# Patient Record
Sex: Female | Born: 2000
Health system: Southern US, Community
[De-identification: ages and names within clinical notes are randomized; demographics above are authoritative.]

## PROBLEM LIST (undated history)

## (undated) DIAGNOSIS — D571 Sickle-cell disease without crisis: Secondary | ICD-10-CM

---
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
## 2019-03-27 ENCOUNTER — Ambulatory Visit (INDEPENDENT_AMBULATORY_CARE_PROVIDER_SITE_OTHER): Payer: 59 | Admitting: *Deleted

## 2019-03-27 ENCOUNTER — Other Ambulatory Visit: Payer: Self-pay

## 2019-03-27 DIAGNOSIS — Z3042 Encounter for surveillance of injectable contraceptive: Secondary | ICD-10-CM

## 2019-03-27 NOTE — Progress Notes (Signed)
Agree with A & P. 

## 2019-03-27 NOTE — Progress Notes (Signed)
Pt is in office for Depo injection. Injection given, pt tolerated well.  Pt advised to RTO 1/21-06/26/2019 for next injection.   Pt states she may be interested in [place].  Pt made aware she is due for a routine visit and may discuss and possible have placed at that visit.   Pt has no other concerns today.   Administrations This Visit    medroxyPROGESTERone (DEPO-PROVERA) injection 150 mg    Admin Date 03/27/2019 Action Given Dose 150 mg Route Intramuscular Administered By Valene Bors, CMA

## 2019-03-31 ENCOUNTER — Encounter (HOSPITAL_COMMUNITY): Payer: Self-pay | Admitting: Emergency Medicine

## 2019-03-31 ENCOUNTER — Emergency Department (HOSPITAL_COMMUNITY): Payer: 59

## 2019-03-31 ENCOUNTER — Emergency Department (HOSPITAL_COMMUNITY)
Admission: EM | Admit: 2019-03-31 | Discharge: 2019-03-31 | Disposition: A | Discharge status: Home / Self Care | Payer: 59 | Attending: Emergency Medicine | Admitting: Emergency Medicine

## 2019-03-31 DIAGNOSIS — R1032 Left lower quadrant pain: Secondary | ICD-10-CM

## 2019-03-31 DIAGNOSIS — R161 Splenomegaly, not elsewhere classified: Secondary | ICD-10-CM

## 2019-03-31 DIAGNOSIS — Z7722 Contact with and (suspected) exposure to environmental tobacco smoke (acute) (chronic): Secondary | ICD-10-CM | POA: Insufficient documentation

## 2019-03-31 DIAGNOSIS — R109 Unspecified abdominal pain: Secondary | ICD-10-CM | POA: Diagnosis present

## 2019-03-31 LAB — BASIC METABOLIC PANEL
Anion gap: 10 (ref 5–15)
BUN: 7 mg/dL (ref 6–20)
CO2: 21 mmol/L — ABNORMAL LOW (ref 22–32)
Calcium: 9.3 mg/dL (ref 8.9–10.3)
Chloride: 106 mmol/L (ref 98–111)
Creatinine, Ser: 0.69 mg/dL (ref 0.44–1.00)
GFR calc Af Amer: >60 mL/min (ref >60)
GFR calc non Af Amer: >60 mL/min (ref >60)
Glucose, Bld: 88 mg/dL (ref 70–99)
Potassium: 3.8 mmol/L (ref 3.5–5.1)
Sodium: 137 mmol/L (ref 135–145)

## 2019-03-31 LAB — CBC
HCT: 34.9 % — ABNORMAL LOW (ref 36.0–46.0)
Hemoglobin: 12.3 g/dL (ref 12.0–15.0)
MCH: 27.1 pg (ref 26.0–34.0)
MCHC: 35.2 g/dL (ref 30.0–36.0)
MCV: 76.9 fL — ABNORMAL LOW (ref 80.0–100.0)
Platelets: 142 10*3/uL — ABNORMAL LOW (ref 150–400)
RBC: 4.54 MIL/uL (ref 3.87–5.11)
RDW: 15.4 % (ref 11.5–15.5)
WBC: 11.3 10*3/uL — ABNORMAL HIGH (ref 4.0–10.5)
nRBC: 0.4 % — ABNORMAL HIGH (ref 0.0–0.2)

## 2019-03-31 LAB — URINALYSIS, ROUTINE W REFLEX MICROSCOPIC
Bilirubin Urine: NEGATIVE
Glucose, UA: NEGATIVE mg/dL
Hgb urine dipstick: NEGATIVE
Ketones, ur: NEGATIVE mg/dL
Leukocytes,Ua: NEGATIVE
Nitrite: NEGATIVE
Protein, ur: NEGATIVE mg/dL
Specific Gravity, Urine: 1.016 (ref 1.005–1.030)
pH: 6 (ref 5.0–8.0)

## 2019-03-31 LAB — I-STAT BETA HCG BLOOD, ED (MC, WL, AP ONLY): I-stat hCG, quantitative: <5 m[IU]/mL (ref <5)

## 2019-03-31 MED ORDER — SODIUM CHLORIDE 0.9 % IV BOLUS
1000.0000 mL | Freq: Once | INTRAVENOUS | Status: AC
  Administered 2019-03-31: 1000 mL via INTRAVENOUS

## 2019-03-31 MED ORDER — KETOROLAC TROMETHAMINE 30 MG/ML IJ SOLN
30.0000 mg | Freq: Once | INTRAMUSCULAR | Status: AC
  Administered 2019-03-31: 30 mg via INTRAVENOUS
  Filled 2019-03-31: qty 1

## 2019-03-31 MED ORDER — ONDANSETRON HCL 4 MG/2ML IJ SOLN
4.0000 mg | Freq: Once | INTRAMUSCULAR | Status: AC
  Administered 2019-03-31: 4 mg via INTRAVENOUS
  Filled 2019-03-31: qty 2

## 2019-03-31 NOTE — ED Triage Notes (Signed)
Pt woke up this morning with a sudden pain in left groin radiating into left flank.

## 2019-03-31 NOTE — Discharge Instructions (Signed)
As we discussed, your CT scan did show mildly enlarged spleen.  You should not engage in any contact sports for 2 to 4 weeks.  Additionally, you need to follow-up with your heme-onc doctor.  You can take Tylenol or Ibuprofen as directed for pain. You can alternate Tylenol and Ibuprofen every 4 hours. If you take Tylenol at 1pm, then you can take Ibuprofen at 5pm. Then you can take Tylenol again at 9pm.   Return the emergency department for any worsening pain, vomiting, fevers or any other worsening or concerning symptoms.

## 2019-03-31 NOTE — ED Notes (Signed)
Patient transported to CT 

## 2019-03-31 NOTE — ED Provider Notes (Signed)
[facility] Provider Note   CSN: [id number] Arrival date & time: 03/31/19  1041     History   Chief Complaint Chief Complaint  Patient presents with   Flank Pain    HPI Ashley Hawkins is a 18 y.o. female who presents for evaluation of left lower quadrant pain that began this morning.  She states the pain is been constant but has varied in intensity.  She describes it as a "stabbing but dull type pain" and states that it is worse with changing positions.  She has not had any dysuria, urinary frequency, hematuria.  Last bowel movement was earlier last night and was normal.  No constipation.  Her LMP was in August and she gets a Depo shot, patient currently describes her pain as 4/10.  She took ibuprofen prior to ED arrival with no improvement in symptoms.  She states she does not smoke and denies any alcohol use.  She does report one episode of marijuana use about 3 weeks ago.  No other drugs.  She is not currently sexually active and denies any vaginal bleeding, vaginal discharge.      The history is provided by the patient.    Past Medical History:  Diagnosis Date   Sickle cell anemia (HCC)     There are no active problems to display for this patient.   No past surgical history on file.   OB History    Gravida  0   Para  0   Term  0   Preterm  0   AB  0   Living  0     SAB  0   TAB  0   Ectopic  0   Multiple  0   Live Births               Home Medications    Prior to Admission medications   Medication Sig Start Date End Date Taking? Authorizing Provider  ibuprofen (ADVIL) 200 MG tablet Take 200 mg by mouth every 6 (six) hours as needed for moderate pain.   Yes [provider]  medroxyPROGESTERone (DEPO-PROVERA) 150 MG/ML injection INJECT 1 ML INTO THE MUSCLE EVERY 3 MONTHS. Patient taking differently: Inject 150 mg into the muscle every 3 (three) months.  08/28/18   Brock BadHarper, Charles A, MD     Family History Family History  Problem Relation Age of Onset   Hypertension Maternal Uncle    Hypertension Maternal Grandmother     Social History Social History   Tobacco Use   Smoking status: Passive Smoke Exposure - Never Smoker   Smokeless tobacco: Never Used  Substance Use Topics   Alcohol use: No    Alcohol/week: 0.0 standard drinks   Drug use: No     Allergies   Codeine   Review of Systems Review of Systems  Constitutional: Negative for fever.  Respiratory: Negative for cough and shortness of breath.   Cardiovascular: Negative for chest pain.  Gastrointestinal: Positive for abdominal pain. Negative for nausea and vomiting.  Genitourinary: Negative for dysuria and hematuria.  Neurological: Negative for headaches.  All other systems reviewed and are negative.    Physical Exam Updated Vital Signs BP 119/69    Pulse 71    Temp 98.7 F (37.1 C) (Oral)    Resp 14    SpO2 100%   Physical Exam Vitals signs and nursing note reviewed.  Constitutional:      Appearance: Normal appearance. She is well-developed.  Comments: Sitting comfortably on examination table  HENT:     Head: Normocephalic and atraumatic.  Eyes:     General: Lids are normal. No scleral icterus.       Right eye: No discharge.        Left eye: No discharge.     Conjunctiva/sclera: Conjunctivae normal.     Pupils: Pupils are equal, round, and reactive to light.  Neck:     Musculoskeletal: Full passive range of motion without pain.  Cardiovascular:     Rate and Rhythm: Normal rate and regular rhythm.     Pulses: Normal pulses.     Heart sounds: Normal heart sounds. No murmur. No friction rub. No gallop.   Pulmonary:     Effort: Pulmonary effort is normal.     Breath sounds: Normal breath sounds.     Comments: Lungs clear to auscultation bilaterally.  Symmetric chest rise.  No wheezing, rales, rhonchi. Abdominal:     Palpations: Abdomen is soft. Abdomen is not rigid.      Tenderness: There is abdominal tenderness in the left lower quadrant. There is left CVA tenderness. There is no guarding.     Comments: Abdomen soft, nondistended.  Tenderness palpation the left lower quadrant.  Left-sided CVA tenderness noted.  Musculoskeletal: Normal range of motion.  Skin:    General: Skin is warm and dry.     Capillary Refill: Capillary refill takes less than 2 seconds.  Neurological:     Mental Status: She is alert and oriented to person, place, and time.  Psychiatric:        Speech: Speech normal.        Behavior: Behavior normal.      ED Treatments / Results  Labs (all labs ordered are listed, but only abnormal results are displayed) Labs Reviewed  BASIC METABOLIC PANEL - Abnormal; Notable for the following components:      Result Value   CO2 21 (*)    All other components within normal limits  CBC - Abnormal; Notable for the following components:   WBC 11.3 (*)    HCT 34.9 (*)    MCV 76.9 (*)    Platelets 142 (*)    nRBC 0.4 (*)    All other components within normal limits  URINALYSIS, ROUTINE W REFLEX MICROSCOPIC  I-STAT BETA HCG BLOOD, ED ([facility], [facility], [facility] ONLY)  GC/CHLAMYDIA PROBE AMP ([place]) NOT AT [facility]    EKG None  Radiology Ct Renal Stone Study  Result Date: 03/31/2019 CLINICAL DATA:  Left flank pain. EXAM: CT ABDOMEN AND PELVIS WITHOUT CONTRAST TECHNIQUE: Multidetector CT imaging of the abdomen and pelvis was performed following the standard protocol without IV contrast. COMPARISON:  None. FINDINGS: Lower chest: The visualized lung bases are clear. Hepatobiliary: Punctate calcification along the inferior right hepatic margin. No gallstones, gallbladder wall thickening, or biliary dilatation. Pancreas: Unremarkable. Spleen: Splenomegaly with a craniocaudal splenic length of 16 cm and an oblique AP diameter of nearly 13 cm. Adrenals/Urinary Tract: Unremarkable adrenal glands. Mild mass effect on the left kidney by the enlarged spleen. No  urolithiasis or hydroureteronephrosis. Unremarkable bladder. Stomach/Bowel: The stomach is unremarkable. There is no evidence of bowel obstruction or inflammation. Status post appendectomy. Vascular/Lymphatic: Normal caliber of the abdominal aorta. No enlarged lymph nodes. Reproductive: The uterus and both ovaries are visualized. No pelvic mass. Other: No intraperitoneal free fluid. Musculoskeletal: No acute osseous abnormality or suspicious osseous lesion. 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 IMPRESSION: 1. Splenomegaly. 2. No urinary  tract calculi or obstruction. Electronically Signed   By: Logan Bores M.D.   On: 03/31/2019 13:57    Procedures Procedures (including critical care time)  Medications Ordered in ED Medications  ketorolac (TORADOL) 30 MG/ML injection 30 mg (30 mg Intravenous Given 03/31/19 1348)  ondansetron (ZOFRAN) injection 4 mg (4 mg Intravenous Given 03/31/19 1348)  sodium chloride 0.9 % bolus 1,000 mL (0 mLs Intravenous Stopped 03/31/19 1555)     Initial Impression / Assessment and Plan / ED Course  I have reviewed the triage vital signs and the nursing notes.  Pertinent labs & imaging results that were available during my care of the patient were reviewed by me and considered in my medical decision making (see chart for details).        18 year old female who presents for evaluation of left lower quadrant abdominal pain that began this morning.  No associated nausea/vomiting, urinary complaints.  No fevers. Patient is afebrile, non-toxic appearing, sitting comfortably on examination table. Vital signs reviewed and stable.  On exam, she has tenderness palpation the left lower quadrant and left-sided CVA tenderness.  Concern for kidney stone versus GU etiology.  Also consider ectopic pregnancy versus ovarian etiology.  We will plan to check labs, urine.  I-STAT beta negative.  UA negative for any hemoglobin or infectious etiology.  BMP is unremarkable.  CBC shows  leukocytosis of 11.3.  Hemoglobin stable at 12.3.  CT renal study shows splenomegaly. No evidence of kidney stone.  No evidence of pelvic masses, uterus and ovaries are normal.  Reevaluation.  Patient is resting comfortably in bed with no signs of acute distress.  She is hemodynamically stable.  Patient reports the pain has resolved.  Repeat abdominal exam is improved with no tenderness, rigidity, guarding.  No peritoneal signs.  She has no tenderness of the left upper or left mid abdomen.  I discussed work-up with patient.  I discussed with her regarding further treatment in ED, including pelvic exam for evaluation of her left lower quadrant abdominal pain.  Patient states her pain is completely resolved and she feels comfortable.  I discussed all treatment options with her and engaged in shared decision making.  After extensive conversation with both patient and aunt, patient wishes declined pelvic exam at this time and would like to be discharged home to monitor symptoms.  At this time, I feel this is reasonable.  I did instruct patient that she needs to follow-up with heme-onc regarding the splenomegaly and that she should not engage in any contact sports. At this time, patient exhibits no emergent life-threatening condition that require further evaluation in ED or admission. Patient had ample opportunity for questions and discussion. All patient's questions were answered with full understanding. Strict return precautions discussed. Patient expresses understanding and agreement to plan.   Portions of this note were generated with [employer]. Dictation errors may occur despite best attempts at proofreading.  Final Clinical Impressions(s) / ED Diagnoses   Final diagnoses:  Left lower quadrant abdominal pain  Splenomegaly    ED Discharge Orders    None       Volanda Napoleon, PA-C 03/31/19 [place], DO 04/01/19 1537

## 2019-04-22 ENCOUNTER — Ambulatory Visit (INDEPENDENT_AMBULATORY_CARE_PROVIDER_SITE_OTHER): Payer: 59 | Admitting: Obstetrics and Gynecology

## 2019-04-22 ENCOUNTER — Other Ambulatory Visit: Payer: Self-pay

## 2019-04-22 ENCOUNTER — Encounter: Payer: Self-pay | Admitting: Obstetrics and Gynecology

## 2019-04-22 VITALS — BP 120/80 | HR 92 | Ht 64.0 in | Wt 145.4 lb

## 2019-04-22 DIAGNOSIS — Z3202 Encounter for pregnancy test, result negative: Secondary | ICD-10-CM | POA: Diagnosis not present

## 2019-04-22 DIAGNOSIS — Z113 Encounter for screening for infections with a predominantly sexual mode of transmission: Secondary | ICD-10-CM | POA: Diagnosis not present

## 2019-04-22 DIAGNOSIS — Z202 Contact with and (suspected) exposure to infections with a predominantly sexual mode of transmission: Secondary | ICD-10-CM

## 2019-04-22 DIAGNOSIS — Z30017 Encounter for initial prescription of implantable subdermal contraceptive: Secondary | ICD-10-CM

## 2019-04-22 DIAGNOSIS — Z01419 Encounter for gynecological examination (general) (routine) without abnormal findings: Secondary | ICD-10-CM

## 2019-04-22 LAB — POCT URINE PREGNANCY: Preg Test, Ur: NEGATIVE

## 2019-04-22 MED ORDER — ETONOGESTREL 68 MG ~~LOC~~ IMPL
68.0000 mg | DRUG_IMPLANT | Freq: Once | SUBCUTANEOUS | Status: AC
  Administered 2019-04-22: 68 mg via SUBCUTANEOUS

## 2019-04-22 NOTE — Progress Notes (Signed)
Pt is here for annual gyn exam. Pt is currently using depo for contraception and she is up to date on those injections. Pt desires STD testing today, swab only

## 2019-04-22 NOTE — Progress Notes (Signed)
WELL-WOMAN PHYSICAL Patient name: NAIRI OSWALD MRN [id number]  Date of birth: [birth year] Chief Complaint:   Gynecologic Exam  History of Present Illness:   Ashley Hawkins is a 18 y.o. G0P0000 African American female being seen today for a routine well-woman exam.  Current complaints: desires STI testing and Nexplanon insertion  PCP: none      does desire labs vaginal swabs only -- declines blood testing Patient's last menstrual period was 04/15/2019 (exact date). The current method of family planning is Depo-Provera injections.  Last pap n/a. Results were: n/a  Review of Systems:   Pertinent items are noted in HPI Denies any headaches, blurred vision, fatigue, shortness of breath, chest pain, abdominal pain, abnormal vaginal discharge/itching/odor/irritation, problems with periods, bowel movements, urination, or intercourse unless otherwise stated above. Pertinent History Reviewed:  Reviewed past medical,surgical, social and family history.  Reviewed problem list, medications and allergies. Physical Assessment:   Vitals:   04/22/19 0942  BP: 120/80  Pulse: 92  Weight: 145 lb 6.4 oz (66 kg)  Height: 5\' 4"  (1.626 m)  Body mass index is 24.96 kg/m.        Physical Examination:   General appearance - well appearing, and in no distress  Mental status - alert, oriented to person, place, and time  Psych:  She has a normal mood and affect  Skin - warm and dry, normal color, no suspicious lesions noted  Chest - effort normal, all lung fields clear to auscultation bilaterally  Heart - normal rate and regular rhythm  Neck:  midline trachea, no thyromegaly or nodules  Breasts - breasts appear normal, no suspicious masses, no skin or nipple changes or  axillary nodes  Abdomen - soft, nontender, nondistended, no masses or organomegaly  Pelvic - VULVA: normal appearing vulva with no masses, tenderness or lesions  VAGINA: normal appearing vagina with normal color and discharge, no  lesions   Blind swab performed for STI testing      Nexplanon Insertion Procedure Patient identified, informed consent performed, consent signed.   Patient does understand that irregular bleeding is a very common side effect of this medication. She was advised to have backup contraception for one week after placement. Pregnancy test in clinic today was negative.  Appropriate time out taken.  Patient's left arm was prepped and draped in the usual sterile fashion. The ruler used to measure and mark insertion area.  Patient was prepped with alcohol swab and then injected with 1.5 ml of 1% lidocaine.  She was prepped with betadine, Nexplanon removed from packaging,  Device confirmed in needle, then inserted full length of needle and withdrawn per handbook instructions. Nexplanon was able to palpated in the patient's arm; patient palpated the insert herself. There was minimal blood loss.  Patient insertion site covered with guaze and a pressure bandage to reduce any bruising.  The patient tolerated the procedure well and was given post procedure instructions. Follow-up via My Chart video visit , unless having problems and need to be seen in the office.  Nexplanon Lot#: [id number] / Expiration Date: 04/18/2021    Results for orders placed or performed in visit on 04/22/19 (from the past 24 hour(s))  POCT urine pregnancy   Collection Time: 04/22/19 10:13 AM  Result Value Ref Range   Preg Test, Ur Negative Negative    Assessment & Plan:  1) Well-Woman Exam   2) Insertion of Nexplanon  - POCT urine pregnancy  3) Possible exposure to STD  - Cervicovaginal  ancillary only( [place]) - will treat according to results   Labs/procedures today: STI (vaginal swab only)   Orders Placed This Encounter  Procedures  . POCT urine pregnancy    Meds:  Meds ordered this encounter  Medications  . etonogestrel (NEXPLANON) implant 68 mg    Follow-up: Return in about 4 weeks (around 05/20/2019) for My  Chart follow-up to Nexplanon.  Raelyn Mora MSN, CNM 04/22/2019 10:14 AM

## 2019-04-22 NOTE — Patient Instructions (Signed)

## 2019-04-23 LAB — CERVICOVAGINAL ANCILLARY ONLY
Chlamydia: NEGATIVE
Comment: NEGATIVE
Comment: NEGATIVE
Comment: NORMAL
Neisseria Gonorrhea: NEGATIVE
Trichomonas: NEGATIVE

## 2019-04-24 NOTE — Progress Notes (Signed)
Subjective: Ashley Hawkins is a G0P0000 who presents to the [facility] today for birth control consult.  She does not have a history of any mental health concerns. She is currently sexually active. She is currently using depo for birth control. Patient reports safe sex practices with condom use however wants a maintenance free option.   BP 120/80   Pulse 92   Ht 5\' 4"  (1.626 m)   Wt 145 lb 6.4 oz (66 kg)   LMP 04/15/2019 (Exact Date)   Breastfeeding No   BMI 24.96 kg/m   Birth Control History:  depo  MDM Patient counseled on all options for birth control today including LARC. Patient desires nexplanon initiated for birth control  Assessment:  18 y.o. female desires nexplanon for birth control  Plan: No further plan   Lynnea Ferrier, Marlinda Mike 04/24/2019 10:30 AM

## 2019-06-23 ENCOUNTER — Ambulatory Visit: Payer: 59 | Attending: Internal Medicine

## 2019-06-23 DIAGNOSIS — Z20822 Contact with and (suspected) exposure to covid-19: Secondary | ICD-10-CM

## 2019-06-24 LAB — NOVEL CORONAVIRUS, NAA: SARS-CoV-2, NAA: NOT DETECTED

## 2019-12-18 ENCOUNTER — Other Ambulatory Visit: Payer: Self-pay

## 2019-12-18 ENCOUNTER — Emergency Department (HOSPITAL_COMMUNITY)
Admission: EM | Admit: 2019-12-18 | Discharge: 2019-12-18 | Disposition: A | Discharge status: Home / Self Care | Payer: 59 | Attending: Emergency Medicine | Admitting: Emergency Medicine

## 2019-12-18 ENCOUNTER — Emergency Department (HOSPITAL_COMMUNITY): Payer: 59

## 2019-12-18 DIAGNOSIS — R079 Chest pain, unspecified: Secondary | ICD-10-CM | POA: Diagnosis not present

## 2019-12-18 DIAGNOSIS — Z7722 Contact with and (suspected) exposure to environmental tobacco smoke (acute) (chronic): Secondary | ICD-10-CM | POA: Diagnosis not present

## 2019-12-18 DIAGNOSIS — R059 Cough, unspecified: Secondary | ICD-10-CM

## 2019-12-18 DIAGNOSIS — Z20822 Contact with and (suspected) exposure to covid-19: Secondary | ICD-10-CM | POA: Insufficient documentation

## 2019-12-18 DIAGNOSIS — R0602 Shortness of breath: Secondary | ICD-10-CM | POA: Diagnosis not present

## 2019-12-18 DIAGNOSIS — R05 Cough: Secondary | ICD-10-CM | POA: Diagnosis present

## 2019-12-18 LAB — SARS CORONAVIRUS 2 BY RT PCR (DIASORIN): SARS Coronavirus 2: NEGATIVE

## 2019-12-18 NOTE — Discharge Instructions (Signed)
Please read and follow all provided instructions.  Your diagnoses today include:  1. Shortness of breath   2. Cough     Tests performed today include:  Chest x-ray - does not show any pneumonia  COVID test - I will call you if your test is positive, you may also check on MyChart for results  Vital signs. See below for your results today.   Medications prescribed:   None  Take any prescribed medications only as directed.  Home care instructions:  Follow any educational materials contained in this packet.  You may use any over-the-counter medications as directed on the packaging up to make you feel better.  Follow-up instructions: Please follow-up with your primary care provider in the next 3 days for further evaluation of your symptoms and a recheck if you are not feeling better.   Return instructions:   Please return to the Emergency Department if you experience worsening symptoms.  Please return with worsening wheezing, shortness of breath, or difficulty breathing.  Return with persistent fever above 101F.   Please return if you have any other emergent concerns.   Additional Information:  Your vital signs today were: BP (!) 106/61 (BP Location: Right Arm)    Pulse 79    Temp 98 F (36.7 C) (Oral)    Resp 18    LMP 12/04/2019 (Exact Date)    SpO2 100%  If your blood pressure (BP) was elevated above 135/85 this visit, please have this repeated by your doctor within one month. --------------

## 2019-12-18 NOTE — ED Triage Notes (Signed)
Pt endorses waking up this morning with cough, shortness of breath, and chest pain when coughing. Denies sick contacts.

## 2019-12-18 NOTE — ED Provider Notes (Signed)
[facility] EMERGENCY DEPARTMENT Provider Note   CSN: [id number] Arrival date & time: 12/18/19  0750     History Chief Complaint  Patient presents with   Cough   Shortness of Breath    Ashley Hawkins is a 19 y.o. female.  Patient with history of sickle cell trait presents to the emergency department with cough and shortness of breath with chest pain with coughing starting upon waking up this morning.  Yesterday she was doing well.  No history of asthma.  She otherwise denies fevers, ear pain, runny nose, sore throat.  No nausea, vomiting, or diarrhea.  She is able to taste and smell okay.  No sick contacts.  No urinary symptoms.  No treatments prior to arrival.  Patient denies risk factors for pulmonary embolism including: unilateral leg swelling, history of DVT/PE/other blood clots, recent immobilizations, recent surgery, recent travel (>4hr segment), malignancy, hemoptysis. She has an implant for birth control.          Past Medical History:  Diagnosis Date   Sickle cell anemia (HCC)     There are no problems to display for this patient.   No past surgical history on file.   OB History    Gravida  0   Para  0   Term  0   Preterm  0   AB  0   Living  0     SAB  0   TAB  0   Ectopic  0   Multiple  0   Live Births              Family History  Problem Relation Age of Onset   Hypertension Maternal Uncle    Hypertension Maternal Grandmother     Social History   Tobacco Use   Smoking status: Passive Smoke Exposure - Never Smoker   Smokeless tobacco: Never Used  Substance Use Topics   Alcohol use: No    Alcohol/week: 0.0 standard drinks   Drug use: No    Home Medications Prior to Admission medications   Medication Sig Start Date End Date Taking? Authorizing Provider  etonogestrel (NEXPLANON) 68 MG IMPL implant 1 each by Subdermal route once.   Yes [provider]  medroxyPROGESTERone (DEPO-PROVERA)  150 MG/ML injection INJECT 1 ML INTO THE MUSCLE EVERY 3 MONTHS. Patient not taking: Reported on 12/18/2019 08/28/18   Brock Bad, MD    Allergies    Codeine  Review of Systems   Review of Systems  Constitutional: Negative for fever.  HENT: Negative for rhinorrhea and sore throat.   Eyes: Negative for redness.  Respiratory: Positive for cough and shortness of breath.   Cardiovascular: Positive for chest pain.  Gastrointestinal: Negative for abdominal pain, diarrhea, nausea and vomiting.  Genitourinary: Negative for dysuria, frequency, hematuria and urgency.  Musculoskeletal: Negative for myalgias.  Skin: Negative for rash.  Neurological: Negative for headaches.    Physical Exam Updated Vital Signs BP 119/76    Pulse 79    Temp 99 F (37.2 C) (Oral)    Resp 22    LMP 12/04/2019 (Exact Date)    SpO2 100%   Physical Exam Vitals and nursing note reviewed.  Constitutional:      Appearance: She is well-developed. She is not diaphoretic.  HENT:     Head: Normocephalic and atraumatic.     Jaw: No trismus.     Right Ear: Tympanic membrane, ear canal and external ear normal.  Left Ear: Tympanic membrane, ear canal and external ear normal.     Nose: Nose normal. No mucosal edema or rhinorrhea.     Mouth/Throat:     Mouth: Mucous membranes are not dry. No oral lesions.     Pharynx: Uvula midline. No oropharyngeal exudate, posterior oropharyngeal erythema or uvula swelling.     Tonsils: No tonsillar abscesses.  Eyes:     General:        Right eye: No discharge.        Left eye: No discharge.     Conjunctiva/sclera: Conjunctivae normal.  Neck:     Vascular: Normal carotid pulses. No carotid bruit or JVD.     Trachea: Trachea normal. No tracheal deviation.  Cardiovascular:     Rate and Rhythm: Normal rate and regular rhythm.     Pulses: No decreased pulses.     Heart sounds: Normal heart sounds, S1 normal and S2 normal. No murmur heard.   Pulmonary:     Effort: Pulmonary  effort is normal. No respiratory distress.     Breath sounds: Normal breath sounds. No wheezing or rales.     Comments: Occasional dry cough during exam.  Chest:     Chest wall: No tenderness.  Abdominal:     General: Bowel sounds are normal.     Palpations: Abdomen is soft.     Tenderness: There is no abdominal tenderness. There is no guarding or rebound.  Musculoskeletal:        General: Normal range of motion.     Cervical back: Normal range of motion and neck supple. No muscular tenderness.     Right lower leg: No tenderness. No edema.     Left lower leg: No tenderness. No edema.     Comments: No calf tenderness, leg swelling.   Lymphadenopathy:     Cervical: No cervical adenopathy.  Skin:    General: Skin is warm and dry.     Coloration: Skin is not pale.  Neurological:     Mental Status: She is alert.     ED Results / Procedures / Treatments   Labs (all labs ordered are listed, but only abnormal results are displayed) Labs Reviewed  SARS CORONAVIRUS 2 BY RT PCR (DIASORIN)    ED ECG REPORT   Date: 12/18/2019  Rate: 70  Rhythm: normal sinus rhythm  QRS Axis: normal  Intervals: PR shortened  ST/T Wave abnormalities: normal  Conduction Disutrbances:none  Narrative Interpretation:   Old EKG Reviewed: none available  I have personally reviewed the EKG tracing and agree with the computerized printout as noted.  Radiology DG Chest 2 View  Result Date: 12/18/2019 CLINICAL DATA:  Shortness of breath and chest pain EXAM: CHEST - 2 VIEW COMPARISON:  Oct 12, 2008 FINDINGS: Lungs are clear. Heart size and pulmonary vascularity are normal. No adenopathy. No pneumothorax. No bone lesions. IMPRESSION: Lungs clear.  Cardiac silhouette within normal limits. Electronically Signed   By: Bretta Bang III M.D.   On: 12/18/2019 08:14    Procedures Procedures (including critical care time)  Medications Ordered in ED Medications - No data to display  ED Course  I have  reviewed the triage vital signs and the nursing notes.  Pertinent labs & imaging results that were available during my care of the patient were reviewed by me and considered in my medical decision making (see chart for details).  Patient seen and examined. CXR reviewed. Pt looks well. Doubt DVT/PE, ACS. Awaiting COVID test.  Vital signs reviewed and are as follows: BP 119/76    Pulse 79    Temp 99 F (37.2 C) (Oral)    Resp 22    LMP 12/04/2019 (Exact Date)    SpO2 100%   2:29 PM Covid still pending.  I went and reassessed the patient.  She is doing well, O2 saturation 100%.  Plan on discharge to home.  I will call her if she has a positive coronavirus test.  Patient counseled on supportive care for viral URI and s/s to return including worsening symptoms, persistent fever, persistent vomiting, or if they have any other concerns. Urged to see PCP if symptoms persist for more than 3 days. Patient verbalizes understanding and agrees with plan.      MDM Rules/Calculators/A&P                          Patient with cough and shortness of breath this morning.  Chest x-ray is clear.  Vital signs are normal without tachycardia or hypoxia.  She is afebrile.  I doubt DVT/PE.  No indications for antibiotics at this time.  Covid test is pending.   Final Clinical Impression(s) / ED Diagnoses Final diagnoses:  Shortness of breath  Cough    Rx / DC Orders ED Discharge Orders    None       Renne Crigler, PA-C 12/18/19 1430    Milagros Loll, MD 12/19/19 [phone]

## 2020-03-26 ENCOUNTER — Encounter (HOSPITAL_COMMUNITY): Payer: Self-pay | Admitting: Emergency Medicine

## 2020-03-26 ENCOUNTER — Emergency Department (HOSPITAL_COMMUNITY)
Admission: EM | Admit: 2020-03-26 | Discharge: 2020-03-26 | Disposition: A | Discharge status: Home / Self Care | Payer: 59 | Attending: Emergency Medicine | Admitting: Emergency Medicine

## 2020-03-26 DIAGNOSIS — J029 Acute pharyngitis, unspecified: Secondary | ICD-10-CM | POA: Diagnosis present

## 2020-03-26 DIAGNOSIS — Z5321 Procedure and treatment not carried out due to patient leaving prior to being seen by health care provider: Secondary | ICD-10-CM | POA: Insufficient documentation

## 2020-03-26 LAB — GROUP A STREP BY PCR: Group A Strep by PCR: NOT DETECTED

## 2020-03-26 MED ORDER — ACETAMINOPHEN 325 MG PO TABS
650.0000 mg | ORAL_TABLET | Freq: Once | ORAL | Status: AC | PRN
  Administered 2020-03-26: 650 mg via ORAL
  Filled 2020-03-26: qty 2

## 2020-03-26 NOTE — ED Notes (Signed)
Witnessed pt leaving.

## 2020-03-26 NOTE — ED Triage Notes (Signed)
Patient here with complaint of sore throat and swollen tonsils. Febrile at 102F. No other complaints, denies being around anyone that was sick, unvaccinated against COVID. Patient alert, oriented, and in no apparent distress at this time.

## 2020-03-29 ENCOUNTER — Other Ambulatory Visit: Payer: Self-pay

## 2020-03-29 ENCOUNTER — Emergency Department (HOSPITAL_COMMUNITY)
Admission: EM | Admit: 2020-03-29 | Discharge: 2020-03-29 | Disposition: A | Discharge status: Home / Self Care | Payer: 59 | Source: Home / Self Care | Attending: Emergency Medicine | Admitting: Emergency Medicine

## 2020-03-29 DIAGNOSIS — J029 Acute pharyngitis, unspecified: Secondary | ICD-10-CM | POA: Insufficient documentation

## 2020-03-29 DIAGNOSIS — Z7722 Contact with and (suspected) exposure to environmental tobacco smoke (acute) (chronic): Secondary | ICD-10-CM | POA: Insufficient documentation

## 2020-03-29 DIAGNOSIS — R1012 Left upper quadrant pain: Secondary | ICD-10-CM | POA: Insufficient documentation

## 2020-03-29 DIAGNOSIS — R112 Nausea with vomiting, unspecified: Secondary | ICD-10-CM

## 2020-03-29 DIAGNOSIS — Z20822 Contact with and (suspected) exposure to covid-19: Secondary | ICD-10-CM | POA: Insufficient documentation

## 2020-03-29 DIAGNOSIS — R509 Fever, unspecified: Secondary | ICD-10-CM | POA: Insufficient documentation

## 2020-03-29 DIAGNOSIS — A419 Sepsis, unspecified organism: Secondary | ICD-10-CM | POA: Diagnosis not present

## 2020-03-29 DIAGNOSIS — R109 Unspecified abdominal pain: Secondary | ICD-10-CM

## 2020-03-29 LAB — URINALYSIS, ROUTINE W REFLEX MICROSCOPIC
Bilirubin Urine: NEGATIVE
Glucose, UA: NEGATIVE mg/dL
Hgb urine dipstick: NEGATIVE
Ketones, ur: 20 mg/dL — AB
Nitrite: NEGATIVE
Protein, ur: 30 mg/dL — AB
Specific Gravity, Urine: 1.017 (ref 1.005–1.030)
pH: 6 (ref 5.0–8.0)

## 2020-03-29 LAB — COMPREHENSIVE METABOLIC PANEL
ALT: 18 U/L (ref 0–44)
AST: 16 U/L (ref 15–41)
Albumin: 4.6 g/dL (ref 3.5–5.0)
Alkaline Phosphatase: 69 U/L (ref 38–126)
Anion gap: 14 (ref 5–15)
BUN: 10 mg/dL (ref 6–20)
CO2: 24 mmol/L (ref 22–32)
Calcium: 9.4 mg/dL (ref 8.9–10.3)
Chloride: 99 mmol/L (ref 98–111)
Creatinine, Ser: 0.4 mg/dL — ABNORMAL LOW (ref 0.44–1.00)
GFR, Estimated: >60 mL/min (ref >60)
Glucose, Bld: 100 mg/dL — ABNORMAL HIGH (ref 70–99)
Potassium: 3.1 mmol/L — ABNORMAL LOW (ref 3.5–5.1)
Sodium: 137 mmol/L (ref 135–145)
Total Bilirubin: 3.8 mg/dL — ABNORMAL HIGH (ref 0.3–1.2)
Total Protein: 8.8 g/dL — ABNORMAL HIGH (ref 6.5–8.1)

## 2020-03-29 LAB — CBC WITH DIFFERENTIAL/PLATELET
Abs Immature Granulocytes: 0.22 10*3/uL — ABNORMAL HIGH (ref 0.00–0.07)
Basophils Absolute: 0.1 10*3/uL (ref 0.0–0.1)
Basophils Relative: 0 %
Eosinophils Absolute: 0.1 10*3/uL (ref 0.0–0.5)
Eosinophils Relative: 1 %
HCT: 29.8 % — ABNORMAL LOW (ref 36.0–46.0)
Hemoglobin: 10.9 g/dL — ABNORMAL LOW (ref 12.0–15.0)
Immature Granulocytes: 1 %
Lymphocytes Relative: 13 %
Lymphs Abs: 2.5 10*3/uL (ref 0.7–4.0)
MCH: 27.9 pg (ref 26.0–34.0)
MCHC: 36.6 g/dL — ABNORMAL HIGH (ref 30.0–36.0)
MCV: 76.2 fL — ABNORMAL LOW (ref 80.0–100.0)
Monocytes Absolute: 2 10*3/uL — ABNORMAL HIGH (ref 0.1–1.0)
Monocytes Relative: 10 %
Neutro Abs: 15.1 10*3/uL — ABNORMAL HIGH (ref 1.7–7.7)
Neutrophils Relative %: 75 %
Platelets: 159 10*3/uL (ref 150–400)
RBC: 3.91 MIL/uL (ref 3.87–5.11)
RDW: 15.6 % — ABNORMAL HIGH (ref 11.5–15.5)
WBC: 19.9 10*3/uL — ABNORMAL HIGH (ref 4.0–10.5)
nRBC: 0.6 % — ABNORMAL HIGH (ref 0.0–0.2)

## 2020-03-29 LAB — RESPIRATORY PANEL BY RT PCR (FLU A&B, COVID)
Influenza A by PCR: NEGATIVE
Influenza B by PCR: NEGATIVE
SARS Coronavirus 2 by RT PCR: NEGATIVE

## 2020-03-29 LAB — I-STAT BETA HCG BLOOD, ED (MC, WL, AP ONLY): I-stat hCG, quantitative: <5 m[IU]/mL (ref <5)

## 2020-03-29 LAB — LIPASE, BLOOD: Lipase: 27 U/L (ref 11–51)

## 2020-03-29 MED ORDER — CIPROFLOXACIN HCL 500 MG PO TABS: 500.0000 mg | ORAL_TABLET | Freq: Two times a day (BID) | ORAL | 0 refills | Status: DC

## 2020-03-29 MED ORDER — ONDANSETRON 4 MG PO TBDP: 4.0000 mg | ORAL_TABLET | Freq: Three times a day (TID) | ORAL | 0 refills | Status: DC | PRN

## 2020-03-29 MED ORDER — DEXAMETHASONE SODIUM PHOSPHATE 10 MG/ML IJ SOLN
10.0000 mg | Freq: Once | INTRAMUSCULAR | Status: AC
  Administered 2020-03-29: 10 mg via INTRAVENOUS
  Filled 2020-03-29: qty 1

## 2020-03-29 MED ORDER — SODIUM CHLORIDE 0.9 % IV BOLUS
1000.0000 mL | Freq: Once | INTRAVENOUS | Status: AC
  Administered 2020-03-29: 1000 mL via INTRAVENOUS

## 2020-03-29 MED ORDER — ONDANSETRON HCL 4 MG/2ML IJ SOLN
4.0000 mg | Freq: Once | INTRAMUSCULAR | Status: AC
  Administered 2020-03-29: 4 mg via INTRAVENOUS
  Filled 2020-03-29: qty 2

## 2020-03-29 NOTE — Discharge Instructions (Signed)
Please read and follow all provided instructions.  Your diagnoses today include:  1. Flank pain   2. Non-intractable vomiting with nausea, unspecified vomiting type   3. Sore throat     Tests performed today include:  Blood cell counts and platelets - high while blood cell count  Kidney and liver function tests  Pancreas function test (called lipase)  Urine test to look for infection - some infection cells in urine  A blood or urine test for pregnancy (women only)  COVID test - was negative  Vital signs. See below for your results today.   Medications prescribed:   Ciprofloxacin - antibiotic  You have been prescribed an antibiotic medicine: take the entire course of medicine even if you are feeling better. Stopping early can cause the antibiotic not to work.   Naproxen - anti-inflammatory pain medication  Do not exceed 500mg  naproxen every 12 hours, take with food  You have been prescribed an anti-inflammatory medication or NSAID. Take with food. Take smallest effective dose for the shortest duration needed for your pain. Stop taking if you experience stomach pain or vomiting.   Take any prescribed medications only as directed.  Home care instructions:   Follow any educational materials contained in this packet.   You can stop the amoxicillin as we do not feel that you have strep throat and stop the ibuprofen as this can cause stomach upset.  Follow-up instructions: Please follow-up with your primary care provider in the next 3 days for further evaluation of your symptoms.    Return instructions:  SEEK IMMEDIATE MEDICAL ATTENTION IF:  The pain does not go away or becomes severe   A temperature above 101F develops   Repeated vomiting occurs (multiple episodes)   The pain becomes localized to portions of the abdomen. The right side could possibly be appendicitis. In an adult, the left lower portion of the abdomen could be colitis or diverticulitis.   Blood is  being passed in stools or vomit (bright red or black tarry stools)   You develop chest pain, difficulty breathing, dizziness or fainting, or become confused, poorly responsive, or inconsolable (young children)  If you have any other emergent concerns regarding your health  Additional Information: Abdominal (belly) pain can be caused by many things. Your caregiver performed an examination and possibly ordered blood/urine tests and imaging (CT scan, x-rays, ultrasound). Many cases can be observed and treated at home after initial evaluation in the emergency department. Even though you are being discharged home, abdominal pain can be unpredictable. Therefore, you need a repeated exam if your pain does not resolve, returns, or worsens. Most patients with abdominal pain don't have to be admitted to the hospital or have surgery, but serious problems like appendicitis and gallbladder attacks can start out as nonspecific pain. Many abdominal conditions cannot be diagnosed in one visit, so follow-up evaluations are very important.  Your vital signs today were: BP 113/68    Pulse 80    Temp 98.3 F (36.8 C) (Oral)    Resp 16    Ht 5\' 5"  (1.651 m)    Wt 59 kg    SpO2 98%    BMI 21.63 kg/m  If your blood pressure (bp) was elevated above 135/85 this visit, please have this repeated by your doctor within one month. --------------

## 2020-03-29 NOTE — ED Provider Notes (Signed)
[facility]-EMERGENCY DEPT Provider Note   CSN: [id number] Arrival date & time: 03/29/20  1017     History Chief Complaint  Patient presents with  . Sore Throat    Ashley Hawkins is a 19 y.o. female.  Patient with history of sickle cell disease presents to the emergency department for evaluation of sore throat, abdominal pain, nausea vomiting.  Patient states that about 4 days ago she developed a sore throat.  She reports having a negative strep test.  She was placed on ibuprofen and amoxicillin which she has been taking.  Patient states that her tonsils became more swollen and she developed pain in her left upper abdomen and left flank.  This was associated with multiple episodes of vomiting.  Last episode of vomiting was 8 AM today.  No history of abdominal surgeries.  No urinary symptoms.  She denies diarrhea or blood in the stool.        Past Medical History:  Diagnosis Date  . Sickle cell anemia (HCC)     There are no problems to display for this patient.   No past surgical history on file.   OB History    Gravida  0   Para  0   Term  0   Preterm  0   AB  0   Living  0     SAB  0   TAB  0   Ectopic  0   Multiple  0   Live Births              Family History  Problem Relation Age of Onset  . Hypertension Maternal Uncle   . Hypertension Maternal Grandmother     Social History   Tobacco Use  . Smoking status: Passive Smoke Exposure - Never Smoker  . Smokeless tobacco: Never Used  Substance Use Topics  . Alcohol use: No    Alcohol/week: 0.0 standard drinks  . Drug use: No    Home Medications Prior to Admission medications   Medication Sig Start Date End Date Taking? Authorizing Provider  etonogestrel (NEXPLANON) 68 MG IMPL implant 1 each by Subdermal route once.    [provider]  medroxyPROGESTERone (DEPO-PROVERA) 150 MG/ML injection INJECT 1 ML INTO THE MUSCLE EVERY 3 MONTHS. Patient not taking:  Reported on 12/18/2019 08/28/18   Brock Bad, MD    Allergies    Codeine  Review of Systems   Review of Systems  Constitutional: Negative for fever.  HENT: Positive for sore throat and trouble swallowing. Negative for rhinorrhea.   Eyes: Negative for redness.  Respiratory: Negative for cough.   Cardiovascular: Negative for chest pain.  Gastrointestinal: Positive for abdominal pain, nausea and vomiting. Negative for diarrhea.  Genitourinary: Negative for dysuria, frequency, hematuria and urgency.  Musculoskeletal: Negative for myalgias.  Skin: Negative for rash.  Neurological: Negative for headaches.    Physical Exam Updated Vital Signs BP 107/87 (BP Location: Right Arm)   Pulse 86   Temp 98.3 F (36.8 C) (Oral)   Resp 16   Ht 5\' 5"  (1.651 m)   Wt 59 kg   SpO2 100%   BMI 21.63 kg/m   Physical Exam Vitals and nursing note reviewed.  Constitutional:      General: She is not in acute distress.    Appearance: She is well-developed.  HENT:     Head: Normocephalic and atraumatic.     Right Ear: External ear normal.     Left Ear:  External ear normal.     Nose: Nose normal.     Mouth/Throat:     Pharynx: Posterior oropharyngeal erythema present. No oropharyngeal exudate.     Comments: No uvular deviation or signs of peritonsillar abscess.  Normal voice. Eyes:     Conjunctiva/sclera: Conjunctivae normal.  Cardiovascular:     Rate and Rhythm: Normal rate and regular rhythm.     Heart sounds: No murmur heard.   Pulmonary:     Effort: No respiratory distress.     Breath sounds: No wheezing, rhonchi or rales.  Abdominal:     Palpations: Abdomen is soft.     Tenderness: There is abdominal tenderness. There is no guarding or rebound.     Comments: Mild tenderness to left upper quadrant and over left flank.  Musculoskeletal:     Cervical back: Normal range of motion and neck supple.     Right lower leg: No edema.     Left lower leg: No edema.  Skin:    General: Skin  is warm and dry.     Findings: No rash.  Neurological:     General: No focal deficit present.     Mental Status: She is alert. Mental status is at baseline.     Motor: No weakness.  Psychiatric:        Mood and Affect: Mood normal.     ED Results / Procedures / Treatments   Labs (all labs ordered are listed, but only abnormal results are displayed) Labs Reviewed  CBC WITH DIFFERENTIAL/PLATELET - Abnormal; Notable for the following components:      Result Value   WBC 19.9 (*)    Hemoglobin 10.9 (*)    HCT 29.8 (*)    MCV 76.2 (*)    MCHC 36.6 (*)    RDW 15.6 (*)    nRBC 0.6 (*)    Neutro Abs 15.1 (*)    Monocytes Absolute 2.0 (*)    Abs Immature Granulocytes 0.22 (*)    All other components within normal limits  COMPREHENSIVE METABOLIC PANEL - Abnormal; Notable for the following components:   Potassium 3.1 (*)    Glucose, Bld 100 (*)    Creatinine, Ser 0.40 (*)    Total Protein 8.8 (*)    Total Bilirubin 3.8 (*)    All other components within normal limits  URINALYSIS, ROUTINE W REFLEX MICROSCOPIC - Abnormal; Notable for the following components:   Color, Urine AMBER (*)    APPearance HAZY (*)    Ketones, ur 20 (*)    Protein, ur 30 (*)    Leukocytes,Ua SMALL (*)    Bacteria, UA RARE (*)    All other components within normal limits  RESPIRATORY PANEL BY RT PCR (FLU A&B, COVID)  URINE CULTURE  LIPASE, BLOOD  I-STAT BETA HCG BLOOD, ED ([facility], [facility], [facility] ONLY)    EKG None  Radiology No results found.  Procedures Procedures (including critical care time)  Medications Ordered in ED Medications  ondansetron (ZOFRAN) injection 4 mg (4 mg Intravenous Given 03/29/20 1149)  dexamethasone (DECADRON) injection 10 mg (10 mg Intravenous Given 03/29/20 1148)  sodium chloride 0.9 % bolus 1,000 mL (0 mLs Intravenous Stopped 03/29/20 1523)    ED Course  I have reviewed the triage vital signs and the nursing notes.  Pertinent labs & imaging results that were available during my  care of the patient were reviewed by me and considered in my medical decision making (see chart for details).  Patient  seen and examined.  Patient was placed in fast track with initial concern for sore throat.  On evaluation, patient's main and most concerning symptoms are are persistent vomiting and abdominal pain.  I have ordered labs and urine on the patient.  Covid testing is pending.  Will give IV antiemetics and IV Decadron.  Vital signs reviewed and are as follows: BP 107/87 (BP Location: Right Arm)   Pulse 86   Temp 98.3 F (36.8 C) (Oral)   Resp 16   Ht 5\' 5"  (1.651 m)   Wt 59 kg   SpO2 100%   BMI 21.63 kg/m   3:31 PM prior to discharge, discussed findings with patient and family now bedside.  Patient is feeling better after IV fluids and antiemetics.  Discussed that she has some white blood cells in her urine.  Combination of flank pain and nausea vomiting raises concern for UTI/kidney infection.  Patient is on amoxicillin and ibuprofen for her sore throat, but states that she has had negative strep test.  We will have her discontinue this.  Will change antibiotic to ciprofloxacin x5 days.  Will also give Zofran for home.  Patient in agreement with plan.   Encouraged PCP follow-up in the next several days.  I placed an order for urine culture to be sent.  The patient was urged to return to the Emergency Department immediately with worsening of current symptoms, worsening abdominal pain, persistent vomiting, blood noted in stools, fever, or any other concerns. The patient verbalized understanding.      MDM Rules/Calculators/A&P                          Patient here with recent sore throat, now with flank pain and vomiting.  Patient does not have fever and does not appear septic.  Treatment plan as above regarding her current symptoms.  Clinically she is much improved in the emergency department and appears well.  Mild LUQ abd pain on exam, remainder of abdomen is nontender.   Vitals are stable, no fever. Labs WBC elevated. Imaging not felt indicated. No signs of dehydration, patient is tolerating PO's. Lungs are clear and no signs suggestive of PNA. Low concern for appendicitis, cholecystitis, pancreatitis, ruptured viscus, kidney stone, aortic dissection, aortic aneurysm or other emergent abdominal etiology. Supportive therapy indicated with return if symptoms worsen.     Final Clinical Impression(s) / ED Diagnoses Final diagnoses:  Flank pain  Non-intractable vomiting with nausea, unspecified vomiting type  Sore throat    Rx / DC Orders ED Discharge Orders         Ordered    ciprofloxacin (CIPRO) 500 MG tablet  2 times daily        03/29/20 1515    ondansetron (ZOFRAN ODT) 4 MG disintegrating tablet  Every 8 hours PRN        03/29/20 1515           13/08/21, PA-C 03/29/20 1534    13/08/21, MD 03/31/20 [phone]

## 2020-03-29 NOTE — ED Triage Notes (Signed)
Patient reports nausea, emesis, and sore throat x3 days. Patient reports she has had x2 negative strep swabs. Patient has not been checked for COVID.

## 2020-03-31 LAB — URINE CULTURE: Culture: NO GROWTH

## 2020-04-01 ENCOUNTER — Emergency Department (HOSPITAL_COMMUNITY): Payer: 59

## 2020-04-01 ENCOUNTER — Encounter (HOSPITAL_COMMUNITY): Payer: Self-pay

## 2020-04-01 ENCOUNTER — Other Ambulatory Visit: Payer: Self-pay

## 2020-04-01 ENCOUNTER — Inpatient Hospital Stay (HOSPITAL_COMMUNITY)
Admission: EM | Admit: 2020-04-01 | Discharge: 2020-04-07 | DRG: 872 | Disposition: A | Discharge status: Home / Self Care | Payer: 59 | Attending: Internal Medicine | Admitting: Internal Medicine

## 2020-04-01 DIAGNOSIS — J36 Peritonsillar abscess: Secondary | ICD-10-CM | POA: Diagnosis present

## 2020-04-01 DIAGNOSIS — R739 Hyperglycemia, unspecified: Secondary | ICD-10-CM | POA: Diagnosis not present

## 2020-04-01 DIAGNOSIS — R509 Fever, unspecified: Secondary | ICD-10-CM

## 2020-04-01 DIAGNOSIS — A419 Sepsis, unspecified organism: Principal | ICD-10-CM | POA: Diagnosis present

## 2020-04-01 DIAGNOSIS — Z8249 Family history of ischemic heart disease and other diseases of the circulatory system: Secondary | ICD-10-CM

## 2020-04-01 DIAGNOSIS — T380X5A Adverse effect of glucocorticoids and synthetic analogues, initial encounter: Secondary | ICD-10-CM | POA: Diagnosis not present

## 2020-04-01 DIAGNOSIS — Z885 Allergy status to narcotic agent status: Secondary | ICD-10-CM

## 2020-04-01 DIAGNOSIS — D573 Sickle-cell trait: Secondary | ICD-10-CM | POA: Diagnosis present

## 2020-04-01 DIAGNOSIS — Z7722 Contact with and (suspected) exposure to environmental tobacco smoke (acute) (chronic): Secondary | ICD-10-CM | POA: Diagnosis present

## 2020-04-01 DIAGNOSIS — D62 Acute posthemorrhagic anemia: Secondary | ICD-10-CM | POA: Diagnosis present

## 2020-04-01 DIAGNOSIS — Z793 Long term (current) use of hormonal contraceptives: Secondary | ICD-10-CM | POA: Diagnosis not present

## 2020-04-01 DIAGNOSIS — R6510 Systemic inflammatory response syndrome (SIRS) of non-infectious origin without acute organ dysfunction: Secondary | ICD-10-CM

## 2020-04-01 DIAGNOSIS — Z20822 Contact with and (suspected) exposure to covid-19: Secondary | ICD-10-CM | POA: Diagnosis present

## 2020-04-01 DIAGNOSIS — J029 Acute pharyngitis, unspecified: Secondary | ICD-10-CM | POA: Diagnosis present

## 2020-04-01 DIAGNOSIS — D72829 Elevated white blood cell count, unspecified: Secondary | ICD-10-CM

## 2020-04-01 DIAGNOSIS — E876 Hypokalemia: Secondary | ICD-10-CM | POA: Diagnosis present

## 2020-04-01 LAB — COMPREHENSIVE METABOLIC PANEL
ALT: 24 U/L (ref 0–44)
AST: 16 U/L (ref 15–41)
Albumin: 4 g/dL (ref 3.5–5.0)
Alkaline Phosphatase: 57 U/L (ref 38–126)
Anion gap: 11 (ref 5–15)
BUN: 9 mg/dL (ref 6–20)
CO2: 23 mmol/L (ref 22–32)
Calcium: 9 mg/dL (ref 8.9–10.3)
Chloride: 102 mmol/L (ref 98–111)
Creatinine, Ser: 0.59 mg/dL (ref 0.44–1.00)
GFR, Estimated: >60 mL/min (ref >60)
Glucose, Bld: 103 mg/dL — ABNORMAL HIGH (ref 70–99)
Potassium: 3.5 mmol/L (ref 3.5–5.1)
Sodium: 136 mmol/L (ref 135–145)
Total Bilirubin: 2.7 mg/dL — ABNORMAL HIGH (ref 0.3–1.2)
Total Protein: 8.9 g/dL — ABNORMAL HIGH (ref 6.5–8.1)

## 2020-04-01 LAB — APTT: aPTT: 35 seconds (ref 24–36)

## 2020-04-01 LAB — CBC WITH DIFFERENTIAL/PLATELET
Abs Immature Granulocytes: 0.93 10*3/uL — ABNORMAL HIGH (ref 0.00–0.07)
Basophils Absolute: 0.1 10*3/uL (ref 0.0–0.1)
Basophils Relative: 0 %
Eosinophils Absolute: 0.1 10*3/uL (ref 0.0–0.5)
Eosinophils Relative: 0 %
HCT: 28.6 % — ABNORMAL LOW (ref 36.0–46.0)
Hemoglobin: 10.4 g/dL — ABNORMAL LOW (ref 12.0–15.0)
Immature Granulocytes: 4 %
Lymphocytes Relative: 8 %
Lymphs Abs: 2 10*3/uL (ref 0.7–4.0)
MCH: 28 pg (ref 26.0–34.0)
MCHC: 36.4 g/dL — ABNORMAL HIGH (ref 30.0–36.0)
MCV: 77.1 fL — ABNORMAL LOW (ref 80.0–100.0)
Monocytes Absolute: 2.9 10*3/uL — ABNORMAL HIGH (ref 0.1–1.0)
Monocytes Relative: 11 %
Neutro Abs: 20.4 10*3/uL — ABNORMAL HIGH (ref 1.7–7.7)
Neutrophils Relative %: 77 %
Platelets: 204 10*3/uL (ref 150–400)
RBC: 3.71 MIL/uL — ABNORMAL LOW (ref 3.87–5.11)
RDW: 15.8 % — ABNORMAL HIGH (ref 11.5–15.5)
WBC: 26.3 10*3/uL — ABNORMAL HIGH (ref 4.0–10.5)
nRBC: 0.2 % (ref 0.0–0.2)

## 2020-04-01 LAB — RESPIRATORY PANEL BY RT PCR (FLU A&B, COVID)
Influenza A by PCR: NEGATIVE
Influenza B by PCR: NEGATIVE
SARS Coronavirus 2 by RT PCR: NEGATIVE

## 2020-04-01 LAB — RETICULOCYTES
Immature Retic Fract: 33.4 % — ABNORMAL HIGH (ref 2.3–15.9)
RBC.: 3.71 MIL/uL — ABNORMAL LOW (ref 3.87–5.11)
Retic Count, Absolute: 181 10*3/uL (ref 19.0–186.0)
Retic Ct Pct: 4.9 % — ABNORMAL HIGH (ref 0.4–3.1)

## 2020-04-01 LAB — URINALYSIS, ROUTINE W REFLEX MICROSCOPIC
Bacteria, UA: NONE SEEN
Bilirubin Urine: NEGATIVE
Glucose, UA: NEGATIVE mg/dL
Ketones, ur: 80 mg/dL — AB
Leukocytes,Ua: NEGATIVE
Nitrite: NEGATIVE
Protein, ur: 100 mg/dL — AB
RBC / HPF: >50 RBC/hpf — ABNORMAL HIGH (ref 0–5)
Specific Gravity, Urine: 1.024 (ref 1.005–1.030)
pH: 5 (ref 5.0–8.0)

## 2020-04-01 LAB — LACTIC ACID, PLASMA: Lactic Acid, Venous: 0.9 mmol/L (ref 0.5–1.9)

## 2020-04-01 LAB — I-STAT BETA HCG BLOOD, ED (MC, WL, AP ONLY): I-stat hCG, quantitative: <5 m[IU]/mL (ref <5)

## 2020-04-01 LAB — PROTIME-INR
INR: 1.3 — ABNORMAL HIGH (ref 0.8–1.2)
Prothrombin Time: 15.8 seconds — ABNORMAL HIGH (ref 11.4–15.2)

## 2020-04-01 MED ORDER — SODIUM CHLORIDE 0.9 % IV SOLN
2.0000 g | Freq: Once | INTRAVENOUS | Status: DC
  Filled 2020-04-01: qty 2

## 2020-04-01 MED ORDER — LACTATED RINGERS IV BOLUS (SEPSIS)
1000.0000 mL | Freq: Once | INTRAVENOUS | Status: AC
  Administered 2020-04-01: 1000 mL via INTRAVENOUS

## 2020-04-01 MED ORDER — ACETAMINOPHEN 650 MG RE SUPP: 650.0000 mg | Freq: Four times a day (QID) | RECTAL | Status: DC | PRN

## 2020-04-01 MED ORDER — PIPERACILLIN-TAZOBACTAM 3.375 G IVPB 30 MIN
3.3750 g | Freq: Once | INTRAVENOUS | Status: AC
  Administered 2020-04-01: 3.375 g via INTRAVENOUS
  Filled 2020-04-01: qty 50

## 2020-04-01 MED ORDER — LACTATED RINGERS IV SOLN: INTRAVENOUS | Status: DC

## 2020-04-01 MED ORDER — ACETAMINOPHEN 325 MG PO TABS
650.0000 mg | ORAL_TABLET | Freq: Four times a day (QID) | ORAL | Status: DC | PRN
  Filled 2020-04-01: qty 2

## 2020-04-01 MED ORDER — IBUPROFEN 400 MG PO TABS
600.0000 mg | ORAL_TABLET | Freq: Four times a day (QID) | ORAL | Status: DC | PRN
  Administered 2020-04-01: 600 mg via ORAL
  Filled 2020-04-01: qty 3

## 2020-04-01 MED ORDER — ONDANSETRON HCL 4 MG/2ML IJ SOLN
4.0000 mg | Freq: Once | INTRAMUSCULAR | Status: AC
  Administered 2020-04-01: 4 mg via INTRAVENOUS
  Filled 2020-04-01: qty 2

## 2020-04-01 MED ORDER — METRONIDAZOLE IN NACL 5-0.79 MG/ML-% IV SOLN
500.0000 mg | Freq: Once | INTRAVENOUS | Status: DC
  Filled 2020-04-01: qty 100

## 2020-04-01 MED ORDER — CLINDAMYCIN PHOSPHATE 600 MG/50ML IV SOLN
600.0000 mg | Freq: Three times a day (TID) | INTRAVENOUS | Status: DC
  Administered 2020-04-01 – 2020-04-07 (×17): 600 mg via INTRAVENOUS
  Filled 2020-04-01 (×19): qty 50

## 2020-04-01 MED ORDER — HYDROMORPHONE HCL 1 MG/ML IJ SOLN
1.0000 mg | INTRAMUSCULAR | Status: AC | PRN
  Administered 2020-04-01 – 2020-04-02 (×2): 1 mg via INTRAVENOUS
  Filled 2020-04-01 (×2): qty 1

## 2020-04-01 MED ORDER — ACETAMINOPHEN 325 MG PO TABS
650.0000 mg | ORAL_TABLET | Freq: Once | ORAL | Status: AC
  Administered 2020-04-01: 650 mg via ORAL
  Filled 2020-04-01: qty 2

## 2020-04-01 MED ORDER — IOHEXOL 300 MG/ML  SOLN
75.0000 mL | Freq: Once | INTRAMUSCULAR | Status: AC | PRN
  Administered 2020-04-01: 75 mL via INTRAVENOUS

## 2020-04-01 MED ORDER — VANCOMYCIN HCL IN DEXTROSE 1-5 GM/200ML-% IV SOLN
1000.0000 mg | Freq: Once | INTRAVENOUS | Status: AC
  Administered 2020-04-01: 1000 mg via INTRAVENOUS
  Filled 2020-04-01: qty 200

## 2020-04-01 MED ORDER — LACTATED RINGERS IV SOLN: INTRAVENOUS | Status: AC

## 2020-04-01 MED ORDER — HYDROMORPHONE HCL 1 MG/ML IJ SOLN
0.5000 mg | Freq: Once | INTRAMUSCULAR | Status: AC
  Administered 2020-04-01: 0.5 mg via INTRAVENOUS
  Filled 2020-04-01: qty 1

## 2020-04-01 NOTE — ED Provider Notes (Addendum)
[facility]-EMERGENCY DEPT Provider Note   CSN: [id number] Arrival date & time: 04/01/20  1255     History Chief Complaint  Patient presents with  . Sore Throat  . Neck Pain    Ashley Hawkins is a 19 y.o. female.  Patient presenting with a complaint of right-sided sore throat and swelling to the face and jaw area.  Difficulty swallowing.  Symptoms kind of started November 5.  Patient was seen at that time and then also seen November 8.  Strep test was negative.  Patient was on Keflex briefly.  But according to records it appears patient was treated with amoxicillin on November 5.  I think that that was changed over to Cipro on November 8.  But then that was stopped.  Patient states things have gotten worse presents here today with fever tachycardia temp 101.2.  When patient was seen November 5 temp was 102.9.  Heart rates around 132 sinus tachycardia.  Respirations around 21 blood pressure 119/74.  Oxygen saturations in the upper 90s.  Patient has not had any of the Covid test.  Patient's vital signs consistent with concerns for SARS criteria.  But patient most likely either has a peritonsillar abscess or a tonsillar abscess on the right side.  We will go ahead and start sepsis criteria broad-spectrum antibiotics and go ahead and give the 2 L of fluid which is 30 cc/kg for her.  She will get Tylenol for the high fever.  Blood cultures done.  Patient did have a significant leukocytosis of 26,000.  Lactic acid not elevated.   Past medical history patient states she has sickle cell anemia.  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  Not sickle cell.        Past Medical History:  Diagnosis Date  . Sickle cell anemia [facility])     Patient Active Problem List   Diagnosis Date Noted  . Tonsillar abscess 04/01/2020  . Fever 04/01/2020  . Leukocytosis 04/01/2020  . SIRS (systemic inflammatory response syndrome) (HCC) 04/01/2020    History  reviewed. No pertinent surgical history.   OB History    Gravida  0   Para  0   Term  0   Preterm  0   AB  0   Living  0     SAB  0   TAB  0   Ectopic  0   Multiple  0   Live Births              Family History  Problem Relation Age of Onset  . Hypertension Maternal Uncle   . Hypertension Maternal Grandmother     Social History   Tobacco Use  . Smoking status: Passive Smoke Exposure - Never Smoker  . Smokeless tobacco: Never Used  Substance Use Topics  . Alcohol use: No    Alcohol/week: 0.0 standard drinks  . Drug use: No    Home Medications Prior to Admission medications   Medication Sig Start Date End Date Taking? Authorizing Provider  amoxicillin (AMOXIL) 875 MG tablet Take 875 mg by mouth 2 (two) times daily. 10 day supply 03/26/20   [provider]  ciprofloxacin (CIPRO) 500 MG tablet Take 1 tablet (500 mg total) by mouth 2 (two) times daily. 03/29/20   Renne Crigler, PA-C  etonogestrel (NEXPLANON) 68 MG IMPL implant 1 each by Subdermal route once.    [provider]  ibuprofen (ADVIL) 800 MG tablet Take 800 mg  by mouth 3 (three) times daily as needed for pain, headache or fever. 03/26/20   [provider]  medroxyPROGESTERone (DEPO-PROVERA) 150 MG/ML injection INJECT 1 ML INTO THE MUSCLE EVERY 3 MONTHS. Patient not taking: Reported on 12/18/2019 08/28/18   Brock Bad, MD  ondansetron (ZOFRAN ODT) 4 MG disintegrating tablet Take 1 tablet (4 mg total) by mouth every 8 (eight) hours as needed for nausea or vomiting. 03/29/20   Renne Crigler, PA-C    Allergies    Codeine  Review of Systems   Review of Systems  Constitutional: Positive for fatigue and fever. Negative for chills.  HENT: Positive for sore throat and trouble swallowing. Negative for rhinorrhea.   Eyes: Negative for visual disturbance.  Respiratory: Negative for cough and shortness of breath.   Cardiovascular: Negative for chest pain and leg swelling.   Gastrointestinal: Negative for abdominal pain, diarrhea, nausea and vomiting.  Genitourinary: Positive for vaginal bleeding. Negative for dysuria.  Musculoskeletal: Negative for back pain and neck pain.  Skin: Negative for rash.  Neurological: Negative for dizziness, light-headedness and headaches.  Hematological: Does not bruise/bleed easily.  Psychiatric/Behavioral: Negative for confusion.    Physical Exam Updated Vital Signs BP 139/83   Pulse (!) 104   Temp (!) 102.2 F (39 C) (Oral)   Resp 20   SpO2 99%   Physical Exam Vitals and nursing note reviewed.  Constitutional:      General: She is in acute distress.     Appearance: She is well-developed. She is ill-appearing.  HENT:     Head: Normocephalic and atraumatic.     Comments: Swelling and tenderness to the right angle of the jaw area.    Mouth/Throat:     Comments: Tongue with some moisture.  Patient not able to open mouth very wide.  Cannot visualize the tonsils or the uvula. Eyes:     Extraocular Movements: Extraocular movements intact.     Conjunctiva/sclera: Conjunctivae normal.  Cardiovascular:     Rate and Rhythm: Normal rate and regular rhythm.     Heart sounds: No murmur heard.   Pulmonary:     Effort: Pulmonary effort is normal. No respiratory distress.     Breath sounds: Normal breath sounds.  Abdominal:     Palpations: Abdomen is soft.     Tenderness: There is no abdominal tenderness.  Musculoskeletal:        General: Normal range of motion.     Cervical back: Neck supple. No rigidity.  Lymphadenopathy:     Cervical: Cervical adenopathy present.  Skin:    General: Skin is warm and dry.     Capillary Refill: Capillary refill takes less than 2 seconds.  Neurological:     General: No focal deficit present.     Mental Status: She is alert and oriented to person, place, and time.     Cranial Nerves: No cranial nerve deficit.     Sensory: No sensory deficit.     Motor: No weakness.     ED Results  / Procedures / Treatments   Labs (all labs ordered are listed, but only abnormal results are displayed) Labs Reviewed  COMPREHENSIVE METABOLIC PANEL - Abnormal; Notable for the following components:      Result Value   Glucose, Bld 103 (*)    Total Protein 8.9 (*)    Total Bilirubin 2.7 (*)    All other components within normal limits  CBC WITH DIFFERENTIAL/PLATELET - Abnormal; Notable for the following components:   WBC  26.3 (*)    RBC 3.71 (*)    Hemoglobin 10.4 (*)    HCT 28.6 (*)    MCV 77.1 (*)    MCHC 36.4 (*)    RDW 15.8 (*)    Neutro Abs 20.4 (*)    Monocytes Absolute 2.9 (*)    Abs Immature Granulocytes 0.93 (*)    All other components within normal limits  RETICULOCYTES - Abnormal; Notable for the following components:   Retic Ct Pct 4.9 (*)    RBC. 3.71 (*)    Immature Retic Fract 33.4 (*)    All other components within normal limits  PROTIME-INR - Abnormal; Notable for the following components:   Prothrombin Time 15.8 (*)    INR 1.3 (*)    All other components within normal limits  URINALYSIS, ROUTINE W REFLEX MICROSCOPIC - Abnormal; Notable for the following components:   APPearance HAZY (*)    Hgb urine dipstick LARGE (*)    Ketones, ur 80 (*)    Protein, ur 100 (*)    RBC / HPF >50 (*)    All other components within normal limits  RESPIRATORY PANEL BY RT PCR (FLU A&B, COVID)  CULTURE, BLOOD (ROUTINE X 2)  CULTURE, BLOOD (ROUTINE X 2)  URINE CULTURE  LACTIC ACID, PLASMA  APTT  HIV ANTIBODY (ROUTINE TESTING W REFLEX)  BASIC METABOLIC PANEL  CBC  I-STAT BETA HCG BLOOD, ED ([facility], [facility], [facility] ONLY)    EKG EKG Interpretation  Date/Time:  Thursday April 01 2020 15:28:31 EST Ventricular Rate:  106 PR Interval:    QRS Duration: 85 QT Interval:  324 QTC Calculation: 431 R Axis:   57 Text Interpretation: Sinus tachycardia Borderline repolarization abnormality Confirmed by Vanetta Mulders [phone]) on 04/01/2020 4:39:43 PM   Radiology CT Soft Tissue Neck  W Contrast  Result Date: 04/01/2020 CLINICAL DATA:  19 year old female with sore throat for 6 days. Jaw and neck pain. Tested negative for strep and COVID-19. EXAM: CT NECK WITH CONTRAST TECHNIQUE: Multidetector CT imaging of the neck was performed using the standard protocol following the bolus administration of intravenous contrast. CONTRAST:  65mL OMNIPAQUE IOHEXOL 300 MG/ML  SOLN COMPARISON:  None. FINDINGS: Pharynx and larynx: Fairly extensive right parapharyngeal space edema, soft tissue inflammation. The larynx is least affected. At the right palatine tonsil there is a relatively large although indistinct round hypodense area measuring up to 2.8 cm diameter (series 2, image 28 and sagittal image 37) consistent with right tonsillar abscess. The left parapharyngeal space remains normal. There is a small retropharyngeal effusion throughout. Salivary glands: Secondary inflammation also in the right submandibular space. The sublingual space is relatively spared at this time. Mildly heterogeneous enhancement of the right submandibular gland compared to the left. Parotid glands remain within normal limits. Thyroid: Negative. Lymph nodes: Reactive appearing bilateral cervical lymphadenopathy. Enhancing nodes on the right measure up to 16 mm short axis individually. No cystic or necrotic nodes. Vascular: The major vascular structures in the neck and at the skull base remain patent including the right IJ. Limited intracranial: Negative. Visualized orbits: Negative. Mastoids and visualized paranasal sinuses: Mild bubbly opacity in the left sphenoid sinus. Minor right maxillary mucosal thickening. Well aerated sinuses otherwise. Tympanic cavities and visible mastoids are clear. Skeleton: No acute dental finding. No osseous abnormality identified. Upper chest: Negative. IMPRESSION: 1. Positive for a large Right Tonsillar Abscess, up to 2.8 cm diameter. 2. Widespread right parapharyngeal inflammation. Small  retropharyngeal effusion. Secondary inflammation in the right submandibular space  also. 3. Right greater than left reactive cervical lymphadenopathy. Electronically Signed   By: Odessa Fleming M.D.   On: 04/01/2020 18:34   DG Chest Port 1 View  Result Date: 04/01/2020 CLINICAL DATA:  19 year old female with concern for sepsis. EXAM: PORTABLE CHEST 1 VIEW COMPARISON:  Chest radiograph dated 12/18/2019. FINDINGS: The heart size and mediastinal contours are within normal limits. Both lungs are clear. The visualized skeletal structures are unremarkable. IMPRESSION: No active disease. Electronically Signed   By: Elgie Collard M.D.   On: 04/01/2020 16:56    Procedures Procedures (including critical care time)  CRITICAL CARE Performed by: Vanetta Mulders Total critical care time: 45 minutes Critical care time was exclusive of separately billable procedures and treating other patients. Critical care was necessary to treat or prevent imminent or life-threatening deterioration. Critical care was time spent personally by me on the following activities: development of treatment plan with patient and/or surrogate as well as nursing, discussions with consultants, evaluation of patient's response to treatment, examination of patient, obtaining history from patient or surrogate, ordering and performing treatments and interventions, ordering and review of laboratory studies, ordering and review of radiographic studies, pulse oximetry and re-evaluation of patient's condition.   Medications Ordered in ED Medications  lactated ringers infusion ( Intravenous New Bag/Given 04/01/20 1654)  lactated ringers infusion ( Intravenous New Bag/Given 04/01/20 2038)  HYDROmorphone (DILAUDID) injection 1 mg (has no administration in time range)  acetaminophen (TYLENOL) tablet 650 mg (has no administration in time range)    Or  acetaminophen (TYLENOL) suppository 650 mg (has no administration in time range)  ibuprofen (ADVIL)  tablet 600 mg (600 mg Oral Given 04/01/20 2038)  clindamycin (CLEOCIN) IVPB 600 mg (has no administration in time range)  lactated ringers bolus 1,000 mL (0 mLs Intravenous Stopped 04/01/20 1741)    And  lactated ringers bolus 1,000 mL (0 mLs Intravenous Stopped 04/01/20 1850)  vancomycin (VANCOCIN) IVPB 1000 mg/200 mL premix (0 mg Intravenous Stopped 04/01/20 1853)  piperacillin-tazobactam (ZOSYN) IVPB 3.375 g (0 g Intravenous Stopped 04/01/20 1751)  acetaminophen (TYLENOL) tablet 650 mg (650 mg Oral Given 04/01/20 1846)  ondansetron (ZOFRAN) injection 4 mg (4 mg Intravenous Given 04/01/20 1842)  HYDROmorphone (DILAUDID) injection 0.5 mg (0.5 mg Intravenous Given 04/01/20 1841)  iohexol (OMNIPAQUE) 300 MG/ML solution 75 mL (75 mLs Intravenous Contrast Given 04/01/20 1819)    ED Course  I have reviewed the triage vital signs and the nursing notes.  Pertinent labs & imaging results that were available during my care of the patient were reviewed by me and considered in my medical decision making (see chart for details).    MDM Rules/Calculators/A&P                          CT scan soft tissue neck shows a tonsillar abscess no evidence of peritonsillar abscess.  Discussed with Dr. Pollyann Kennedy.  He states that this will be treated with antibiotics is nonsurgical.  IV fluids.  Discussed with hospitalist for admission.  Patient not true sepsis.  But fluids were beneficial broad-spectrum antibiotics were probably not needed at this point clindamycin would be appropriate treatment from this point forward.  Covid testing has been ordered and is pending.  Patient has not been vaccinated.     Final Clinical Impression(s) / ED Diagnoses Final diagnoses:  Tonsillar abscess    Rx / DC Orders ED Discharge Orders    None       Latorya Bautch,  Lorin PicketScott, MD 04/01/20 [id number]    Vanetta MuldersZackowski, Arsh Feutz, MD 04/01/20 [phone]

## 2020-04-01 NOTE — Progress Notes (Signed)
Code Sepsis initiated at 1621. Elink was following, now complete. LA 0.9. BC drawn and ABX administered. Had allotted IVF replacement.   Moniqua Engebretsen eLink RN

## 2020-04-01 NOTE — ED Notes (Signed)
Offered patient tylenol. Patient refused at this time due to painful swallowing.

## 2020-04-01 NOTE — Progress Notes (Signed)
A consult was received from an ED physician for Vanc and cefepime per pharmacy dosing (for an indication other than meningitis). The patient's profile has been reviewed for ht/wt/allergies/indication/available labs. A one time order has been placed for vancomycin, and after discussion with EDP, Cefepime/Flagyl was changed to Zosyn 3.375 g IV x 1 d/t critical shortage of IV Flagyl.  Further antibiotics/pharmacy consults should be ordered by admitting physician if indicated.                       Bernadene Person, PharmD, BCPS [phone] 04/01/2020, 4:36 PM

## 2020-04-01 NOTE — ED Triage Notes (Signed)
Patient arrived via [facility]  C/o sore throat since Friday and today c/o jaw and neck pain.  Patient tested negative for strep and covid.  Pt was given keflex    98.3 BP-133/85 p-100 99% RA RR-20  A/Ox4 Ambulatory with ems

## 2020-04-01 NOTE — ED Notes (Signed)
Pt to CT

## 2020-04-01 NOTE — H&P (Signed)
History and Physical    Ashley Hawkins JSH:[id number] DOB: June 21, 2000 DOA: 04/01/2020  PCP: Patient, No Pcp Per   Patient coming from: Home  Chief Complaint: Fever, sore throat  HPI: Ashley Hawkins is a 19 y.o. female with medical history of sickle cell anemia followed by hematology.  She presents with complaint of fever and sore throat.  She reports symptoms began a week ago.  She was seen in an urgent care and had a negative strep test and was placed on amoxicillin.  A few days later she continued to have sore throat and difficulty eating and had developed some nausea and vomiting was seen here in the emergency room.  At that time strep test was again performed and was negative and Covid swab was negative.  She was changed from amoxicillin to Keflex.  She continues to have fevers that are greater than 102 degrees.  She has had some nausea and vomiting is not able to tolerate p.o. intake secondary to worsening pain with swallowing.  Pain is in the right side of her throat.  Is not able to completely open her mouth as she normally does secondary to the pain.  She has no trouble breathing and has not had any shortness of breath, abdominal pain, chest pain, rash.  She has no known sick contacts. She does smoke 2 to 3 days many cigars a day.  She denies any other tobacco use, alcohol use or illicit drug use.  ED Course:   In the emergency room patient found of a tonsillar abscess on CT of her neck.  She has has leukocytosis with WBCs greater than 26,000.  ER provider discussed with ENT who stated that since it was a tonsillar abscess that should be amendable to treatment with antibiotic and not require surgery.  There is no peritonsillar involvement on the CT scan.  Hospital service been asked to admit further management.  In the emergency room patient also had SIRS criteria with tachycardia and tachypnea, fever and leukocytosis given IV fluids and broad-spectrum anabiotic with Zosyn and  vancomycin.  Review of Systems:  General: Reports fever and chills.  Denies weakness, weight loss, night sweats.  Denies dizziness.  Denies change in appetite HENT: Denies head trauma, headache, denies change in hearing, tinnitus.  Denies nasal congestion or bleeding.  Denies sore throat, sores in mouth.  Denies difficulty swallowing Eyes: Denies blurry vision, pain in eye, drainage.  Denies discoloration of eyes. Neck: Denies pain.  Denies swelling.  Denies pain with movement. Cardiovascular: Denies chest pain, palpitations.  Denies edema.  Denies orthopnea Respiratory: Denies shortness of breath, cough.  Denies wheezing.  Denies sputum production Gastrointestinal: Reports nausea and one episode of vomiting earlier.  Denies abdominal pain, swelling.  Denies diarrhea.  Denies melena.  Denies hematemesis. Musculoskeletal: Denies limitation of movement.  Denies deformity or swelling.  Denies pain.  Denies arthralgias or myalgias. Genitourinary: Denies pelvic pain.  Denies urinary frequency or hesitancy.  Denies dysuria.  Skin: Denies rash.  Denies petechiae, purpura, ecchymosis. Neurological: Denies headache.  Denies syncope.  Denies seizure activity.  Denies weakness or paresthesia.  Denies slurred speech, drooping face.  Denies visual change. Psychiatric: Denies depression, anxiety.  Denies suicidal thoughts or ideation.  Denies hallucinations.  Past Medical History:  Diagnosis Date  . Sickle cell anemia (HCC)     History reviewed. No pertinent surgical history.  Social History  reports that she is a non-smoker but has been exposed to tobacco smoke. She has never  used smokeless tobacco. She reports that she does not drink alcohol and does not use drugs.  Allergies  Allergen Reactions  . Codeine Hives    Family History  Problem Relation Age of Onset  . Hypertension Maternal Uncle   . Hypertension Maternal Grandmother      Prior to Admission medications   Medication Sig Start  Date End Date Taking? Authorizing Provider  amoxicillin (AMOXIL) 875 MG tablet Take 875 mg by mouth 2 (two) times daily. 10 day supply 03/26/20   [provider]  ciprofloxacin (CIPRO) 500 MG tablet Take 1 tablet (500 mg total) by mouth 2 (two) times daily. 03/29/20   Renne Crigler, PA-C  etonogestrel (NEXPLANON) 68 MG IMPL implant 1 each by Subdermal route once.    [provider]  ibuprofen (ADVIL) 800 MG tablet Take 800 mg by mouth 3 (three) times daily as needed for pain, headache or fever. 03/26/20   [provider]  medroxyPROGESTERone (DEPO-PROVERA) 150 MG/ML injection INJECT 1 ML INTO THE MUSCLE EVERY 3 MONTHS. Patient not taking: Reported on 12/18/2019 08/28/18   Brock Bad, MD  ondansetron (ZOFRAN ODT) 4 MG disintegrating tablet Take 1 tablet (4 mg total) by mouth every 8 (eight) hours as needed for nausea or vomiting. 03/29/20   Renne Crigler, PA-C    Physical Exam: Vitals:   04/01/20 1900 04/01/20 1915 04/01/20 1930 04/01/20 1943  BP: 136/88 139/82 (!) 141/85 (!) 141/74  Pulse: (!) 109 (!) 118 (!) 117 (!) 120  Resp: (!) 23 (!) 26 17 (!) 22  Temp:    (!) 102.2 F (39 C)  TempSrc:    Oral  SpO2: 98% 99% 100% 100%    Constitutional: NAD, calm, comfortable Vitals:   04/01/20 1900 04/01/20 1915 04/01/20 1930 04/01/20 1943  BP: 136/88 139/82 (!) 141/85 (!) 141/74  Pulse: (!) 109 (!) 118 (!) 117 (!) 120  Resp: (!) 23 (!) 26 17 (!) 22  Temp:    (!) 102.2 F (39 C)  TempSrc:    Oral  SpO2: 98% 99% 100% 100%   General: WDWN, Alert and oriented x3.  Eyes: EOMI, PERRL, lids and conjunctivae normal.  Sclera nonicteric HENT:  [place]/AT, external ears normal.  Nares patent without epistasis.  Mucous membranes are dry.  Right posterior oropharynx edematous with swollen tonsil.  Patient not able to completely open her mouth secondary to pain.  Normal dentition.  Mild swelling of right side of face Neck: Soft, normal range of motion, supple, right-sided tender  lymphadenopathy palpated.  No thyromegaly.  Trachea midline Respiratory: clear to auscultation bilaterally, no wheezing, no crackles. Normal respiratory effort. No accessory muscle use.  Cardiovascular: Regular rate and rhythm, no murmurs / rubs / gallops. No extremity edema.   Abdomen: Soft, no tenderness, nondistended, no rebound or guarding. No hepatosplenomegaly. Bowel sounds normoactive Musculoskeletal: FROM. no clubbing / cyanosis. No joint deformity upper and lower extremities. no contractures. Normal muscle tone.  Skin: Warm, dry, intact no rashes, lesions, ulcers. No induration Neurologic: CN 2-12 grossly intact.  Normal muscle but understandable.. Strength 5/5 in all extremities.   Psychiatric: Normal judgment and insight.  Normal mood.    Labs on Admission: I have personally reviewed following labs and imaging studies  CBC: Recent Labs  Lab 03/29/20 1148 04/01/20 1355  WBC 19.9* 26.3*  NEUTROABS 15.1* 20.4*  HGB 10.9* 10.4*  HCT 29.8* 28.6*  MCV 76.2* 77.1*  PLT 159 204    Basic Metabolic Panel: Recent Labs  Lab 03/29/20  1148 04/01/20 1355  NA 137 136  K 3.1* 3.5  CL 99 102  CO2 24 23  GLUCOSE 100* 103*  BUN 10 9  CREATININE 0.40* 0.59  CALCIUM 9.4 9.0    GFR: Estimated Creatinine Clearance: 101.8 mL/min (by C-G formula based on SCr of 0.59 mg/dL).  Liver Function Tests: Recent Labs  Lab 03/29/20 1148 04/01/20 1355  AST 16 16  ALT 18 24  ALKPHOS 69 57  BILITOT 3.8* 2.7*  PROT 8.8* 8.9*  ALBUMIN 4.6 4.0    Urine analysis:    Component Value Date/Time   COLORURINE YELLOW 04/01/2020 1644   APPEARANCEUR HAZY (A) 04/01/2020 1644   LABSPEC 1.024 04/01/2020 1644   PHURINE 5.0 04/01/2020 1644   GLUCOSEU NEGATIVE 04/01/2020 1644   HGBUR LARGE (A) 04/01/2020 1644   BILIRUBINUR NEGATIVE 04/01/2020 1644   KETONESUR 80 (A) 04/01/2020 1644   PROTEINUR 100 (A) 04/01/2020 1644   UROBILINOGEN 1.0 10/12/2008 0145   NITRITE NEGATIVE 04/01/2020 1644    LEUKOCYTESUR NEGATIVE 04/01/2020 1644    Radiological Exams on Admission: CT Soft Tissue Neck W Contrast  Result Date: 04/01/2020 CLINICAL DATA:  19 year old female with sore throat for 6 days. Jaw and neck pain. Tested negative for strep and COVID-19. EXAM: CT NECK WITH CONTRAST TECHNIQUE: Multidetector CT imaging of the neck was performed using the standard protocol following the bolus administration of intravenous contrast. CONTRAST:  23mL OMNIPAQUE IOHEXOL 300 MG/ML  SOLN COMPARISON:  None. FINDINGS: Pharynx and larynx: Fairly extensive right parapharyngeal space edema, soft tissue inflammation. The larynx is least affected. At the right palatine tonsil there is a relatively large although indistinct round hypodense area measuring up to 2.8 cm diameter (series 2, image 28 and sagittal image 37) consistent with right tonsillar abscess. The left parapharyngeal space remains normal. There is a small retropharyngeal effusion throughout. Salivary glands: Secondary inflammation also in the right submandibular space. The sublingual space is relatively spared at this time. Mildly heterogeneous enhancement of the right submandibular gland compared to the left. Parotid glands remain within normal limits. Thyroid: Negative. Lymph nodes: Reactive appearing bilateral cervical lymphadenopathy. Enhancing nodes on the right measure up to 16 mm short axis individually. No cystic or necrotic nodes. Vascular: The major vascular structures in the neck and at the skull base remain patent including the right IJ. Limited intracranial: Negative. Visualized orbits: Negative. Mastoids and visualized paranasal sinuses: Mild bubbly opacity in the left sphenoid sinus. Minor right maxillary mucosal thickening. Well aerated sinuses otherwise. Tympanic cavities and visible mastoids are clear. Skeleton: No acute dental finding. No osseous abnormality identified. Upper chest: Negative. IMPRESSION: 1. Positive for a large Right Tonsillar  Abscess, up to 2.8 cm diameter. 2. Widespread right parapharyngeal inflammation. Small retropharyngeal effusion. Secondary inflammation in the right submandibular space also. 3. Right greater than left reactive cervical lymphadenopathy. Electronically Signed   By: Odessa Fleming M.D.   On: 04/01/2020 18:34   DG Chest Port 1 View  Result Date: 04/01/2020 CLINICAL DATA:  19 year old female with concern for sepsis. EXAM: PORTABLE CHEST 1 VIEW COMPARISON:  Chest radiograph dated 12/18/2019. FINDINGS: The heart size and mediastinal contours are within normal limits. Both lungs are clear. The visualized skeletal structures are unremarkable. IMPRESSION: No active disease. Electronically Signed   By: Elgie Collard M.D.   On: 04/01/2020 16:56     Assessment/Plan Principal Problem:   Tonsillar abscess Ashley Hawkins will be admitted to MedSurg floor. She was given dose of Zosyn and vancomycin for broad-spectrum anabiotic  in the emergency room.  With patient having a tonsillar abscess on CT scan with no peritonsillar involvement will change to clindamycin.  ER physician discussed case with ENT who stated patient did not require surgery since it is not peritonsillar and that should resolve with antibiotic.  Will consult ENT in the morning IV fluid hydration with LR at 100 mL's per hour overnight.  Pain control provided. Check CBC, electrolytes renal function morning  Active Problems:   Fever Tylenol and Motrin as needed for fever    Leukocytosis Recheck CBC in morning.    SIRS (systemic inflammatory response syndrome) (HCC) Patient meets SIRS criteria with leukocytosis, tachycardia and tachypnea.  Patient appears nontoxic and has no organ dysfunction or hypotension.  Initial lactic acid is low.    DVT prophylaxis: Padua score low.  TED hose and ambulation for DVT prophylaxis Code Status:   Full code Family Communication:  Diagnosis and plan discussed with patient and her family member who is at bedside.   Questions were answered.  They agree with plan.  Further recommendation to follow as clinically indicated Disposition Plan:   Patient is from:  Home  Anticipated DC to:  Home  Anticipated DC date:  Anticipate at least 2 midnight stay in the hospital to treat acute medical condition  Anticipated DC barriers: No barriers to discharge identified at this time  Consults called:  Consult ENT in morning Admission status:  Inpatient  Claudean SeveranceBradley S Kynli Chou MD [facility]  How to contact the [facility] Attending or Consulting provider 7A - 7P or covering provider during after hours 7P -7A, for this patient?   1. Check the care team in [facility] and look for a) attending/consulting [facility] provider listed and b) the [facility] team listed 2. Log into [email or web address] and use [facility]'s universal password to access. If you do not have the password, please contact the hospital operator. 3. Locate the [facility] provider you are looking for under [facility] and page to a number that you can be directly reached. 4. If you still have difficulty reaching the provider, please page the [facility] (Director on Call) for the Hospitalists listed on amion for assistance.  04/01/2020, 8:07 PM

## 2020-04-02 ENCOUNTER — Ambulatory Visit: Payer: 59 | Admitting: Internal Medicine

## 2020-04-02 LAB — CBC
HCT: 22.8 % — ABNORMAL LOW (ref 36.0–46.0)
Hemoglobin: 8.2 g/dL — ABNORMAL LOW (ref 12.0–15.0)
MCH: 27.7 pg (ref 26.0–34.0)
MCHC: 36 g/dL (ref 30.0–36.0)
MCV: 77 fL — ABNORMAL LOW (ref 80.0–100.0)
Platelets: 153 10*3/uL (ref 150–400)
RBC: 2.96 MIL/uL — ABNORMAL LOW (ref 3.87–5.11)
RDW: 15.8 % — ABNORMAL HIGH (ref 11.5–15.5)
WBC: 28.5 10*3/uL — ABNORMAL HIGH (ref 4.0–10.5)
nRBC: 0.1 % (ref 0.0–0.2)

## 2020-04-02 LAB — BASIC METABOLIC PANEL
Anion gap: 8 (ref 5–15)
BUN: <5 mg/dL — ABNORMAL LOW (ref 6–20)
CO2: 24 mmol/L (ref 22–32)
Calcium: 8.2 mg/dL — ABNORMAL LOW (ref 8.9–10.3)
Chloride: 103 mmol/L (ref 98–111)
Creatinine, Ser: 0.46 mg/dL (ref 0.44–1.00)
GFR, Estimated: >60 mL/min (ref >60)
Glucose, Bld: 104 mg/dL — ABNORMAL HIGH (ref 70–99)
Potassium: 3.4 mmol/L — ABNORMAL LOW (ref 3.5–5.1)
Sodium: 135 mmol/L (ref 135–145)

## 2020-04-02 LAB — HEMOGLOBIN AND HEMATOCRIT, BLOOD
HCT: 26.4 % — ABNORMAL LOW (ref 36.0–46.0)
Hemoglobin: 9.6 g/dL — ABNORMAL LOW (ref 12.0–15.0)

## 2020-04-02 LAB — HIV ANTIBODY (ROUTINE TESTING W REFLEX): HIV Screen 4th Generation wRfx: NONREACTIVE

## 2020-04-02 LAB — URINE CULTURE: Culture: NO GROWTH

## 2020-04-02 MED ORDER — DEXAMETHASONE SODIUM PHOSPHATE 10 MG/ML IJ SOLN
8.0000 mg | Freq: Three times a day (TID) | INTRAMUSCULAR | Status: AC
  Administered 2020-04-02 – 2020-04-03 (×3): 8 mg via INTRAVENOUS
  Filled 2020-04-02: qty 0.8
  Filled 2020-04-02 (×2): qty 1

## 2020-04-02 MED ORDER — PNEUMOCOCCAL VAC POLYVALENT 25 MCG/0.5ML IJ INJ
0.5000 mL | INJECTION | INTRAMUSCULAR | Status: DC
  Filled 2020-04-02: qty 0.5

## 2020-04-02 MED ORDER — POTASSIUM CHLORIDE 10 MEQ/100ML IV SOLN
10.0000 meq | INTRAVENOUS | Status: AC
  Administered 2020-04-02 (×3): 10 meq via INTRAVENOUS
  Filled 2020-04-02 (×3): qty 100

## 2020-04-02 NOTE — Progress Notes (Signed)
PROGRESS NOTE  Ashley Hawkins UJW:[id number] DOB: 2001/02/12 DOA: 04/01/2020 PCP: Marcine Matar, MD  HPI/Recap of past 24 hours: Ashley Hawkins is a 19 y.o. female with medical history of sickle cell anemia followed by hematology.  She presents with complaint of fever and sore throat.  She reports symptoms began a week ago.  She was seen in an urgent care and had a negative strep test and was placed on amoxicillin.  A few days later she continued to have sore throat and difficulty eating and had developed some nausea and vomiting was seen here in the emergency room.  At that time strep test was again performed and was negative and Covid swab was negative.  She was changed from amoxicillin to Keflex.  She continues to have fevers that are greater than 102 degrees.  She has had some nausea and vomiting is not able to tolerate p.o. intake secondary to worsening pain with swallowing.  Pain is in the right side of her throat.  Is not able to completely open her mouth as she normally does secondary to the pain.  She has no trouble breathing and has not had any shortness of breath, abdominal pain, chest pain, rash.  She has no known sick contacts. She does smoke 2 to 3 days many cigars a day.  She denies any other tobacco use, alcohol use or illicit drug use.  ED Course:   In the emergency room patient found of a tonsillar abscess on CT of her neck.  She has has leukocytosis with WBCs greater than 26,000.  ER provider discussed with ENT who stated that since it was a tonsillar abscess that should be amendable to treatment with antibiotic and not require surgery.  There is no peritonsillar involvement on the CT scan.  Hospital service been asked to admit further management.  In the emergency room patient also had SIRS criteria with tachycardia and tachypnea, fever and leukocytosis given IV fluids and broad-spectrum anabiotic with Zosyn and vancomycin.   04/02/20:   Tonsillar abscess.  Still with  swelling.  Discussed with ENT, Dr. Marene Lenz who recommended to add to IV clindamycin IV Decadron 8 mg TID x 3 doses.  Assessment/Plan: Principal Problem:   Tonsillar abscess Active Problems:   Fever   Leukocytosis   SIRS (systemic inflammatory response syndrome) (HCC)  Sepsis secondary to tonsillar abscess seen on CT scan. Presented with leukocytosis, WBC 20 8.5K, tachycardia and tachypnea, with tonsillar abscess on CT scan. Blood cultures taken on 04/01/2020 - to date. Discussed with ENT on 04/02/2020, Dr. Marene Lenz, recommended management with IV antibiotics, clindamycin 600 or 900 mg Q8H.  Add IV Decadron 8 mg every 8 hours x3 doses for swelling. Pain control as needed.  Continue IV fluid Diet as tolerated  Hypokalemia Serum potassium 3.4 Repleted intravenously. Repeat BMP in the morning.  Microcytic anemia, chronic Presented with hemoglobin 10.9 with MCV of 76. Hemoglobin 9.6 on 04/02/2020. Obtain iron studies Replete if indicated.    Code Status: Full code.  Family Communication: Updated her mother via phone.  Disposition Plan: Likely will DC to home after 48 to 72 hours of IV antibiotics.   Consultants:  Curb sided with ENT Dr. Marene Lenz on 04/02/2020.  Procedures:  None.  Antimicrobials:  IV clindamycin.  DVT prophylaxis: SCDs.  Status is: Inpatient    Dispo:  Patient From: Home  Planned Disposition: Home  Expected discharge date: 04/05/20  Medically stable for discharge: No, ongoing management of tonsillar abscess.  Objective: Vitals:   04/02/20 0300 04/02/20 0330 04/02/20 0522 04/02/20 0928  BP: (!) 138/116 134/79 (!) 120/95 127/78  Pulse: (!) 103 86 89 98  Resp: 19 20 20 16   Temp:   99 F (37.2 C) 98.1 F (36.7 C)  TempSrc:   Oral   SpO2: 98% 100% 94% 100%    Intake/Output Summary (Last 24 hours) at 04/02/2020 1328 Last data filed at 04/02/2020 1000 Gross per 24 hour  Intake 1706.19 ml  Output 0 ml  Net 1706.19  ml   There were no vitals filed for this visit.  Exam:   General: 19 y.o. year-old female well developed well nourished in no acute distress.  Alert and oriented x3.  Cardiovascular: Regular rate and rhythm with no rubs or gallops.  No thyromegaly or JVD noted.    Respiratory: Clear to auscultation with no wheezes or rales. Good inspiratory effort.  Some neck swelling R>L.  Abdomen: Soft nontender nondistended with normal bowel sounds x4 quadrants.  Musculoskeletal: No lower extremity edema. 2/4 pulses in all 4 extremities.  Skin: No ulcerative lesions noted or rashes,  Psychiatry: Mood is appropriate for condition and setting   Data Reviewed: CBC: Recent Labs  Lab 03/29/20 1148 04/01/20 1355 04/02/20 0245 04/02/20 0630  WBC 19.9* 26.3* 28.5*  --   NEUTROABS 15.1* 20.4*  --   --   HGB 10.9* 10.4* 8.2* 9.6*  HCT 29.8* 28.6* 22.8* 26.4*  MCV 76.2* 77.1* 77.0*  --   PLT 159 204 153  --    Basic Metabolic Panel: Recent Labs  Lab 03/29/20 1148 04/01/20 1355 04/02/20 0245  NA 137 136 135  K 3.1* 3.5 3.4*  CL 99 102 103  CO2 24 23 24   GLUCOSE 100* 103* 104*  BUN 10 9 <5*  CREATININE 0.40* 0.59 0.46  CALCIUM 9.4 9.0 8.2*   GFR: Estimated Creatinine Clearance: 101.8 mL/min (by C-G formula based on SCr of 0.46 mg/dL). Liver Function Tests: Recent Labs  Lab 03/29/20 1148 04/01/20 1355  AST 16 16  ALT 18 24  ALKPHOS 69 57  BILITOT 3.8* 2.7*  PROT 8.8* 8.9*  ALBUMIN 4.6 4.0   Recent Labs  Lab 03/29/20 1148  LIPASE 27   No results for input(s): AMMONIA in the last 168 hours. Coagulation Profile: Recent Labs  Lab 04/01/20 1621  INR 1.3*   Cardiac Enzymes: No results for input(s): CKTOTAL, CKMB, CKMBINDEX, TROPONINI in the last 168 hours. BNP (last 3 results) No results for input(s): PROBNP in the last 8760 hours. HbA1C: No results for input(s): HGBA1C in the last 72 hours. CBG: No results for input(s): GLUCAP in the last 168 hours. Lipid  Profile: No results for input(s): CHOL, HDL, LDLCALC, TRIG, CHOLHDL, LDLDIRECT in the last 72 hours. Thyroid Function Tests: No results for input(s): TSH, T4TOTAL, FREET4, T3FREE, THYROIDAB in the last 72 hours. Anemia Panel: Recent Labs    04/01/20 1355  RETICCTPCT 4.9*   Urine analysis:    Component Value Date/Time   COLORURINE YELLOW 04/01/2020 1644   APPEARANCEUR HAZY (A) 04/01/2020 1644   LABSPEC 1.024 04/01/2020 1644   PHURINE 5.0 04/01/2020 1644   GLUCOSEU NEGATIVE 04/01/2020 1644   HGBUR LARGE (A) 04/01/2020 1644   BILIRUBINUR NEGATIVE 04/01/2020 1644   KETONESUR 80 (A) 04/01/2020 1644   PROTEINUR 100 (A) 04/01/2020 1644   UROBILINOGEN 1.0 10/12/2008 0145   NITRITE NEGATIVE 04/01/2020 1644   LEUKOCYTESUR NEGATIVE 04/01/2020 1644   Sepsis Labs: @LABRCNTIP (procalcitonin:4,lacticidven:4)  ) Recent Results (from  the past 240 hour(s))  Group A Strep by PCR     Status: None   Collection Time: 03/26/20  4:48 PM   Specimen: Throat; Sterile Swab  Result Value Ref Range Status   Group A Strep by PCR NOT DETECTED NOT DETECTED Final    Comment: Performed at [facility], [address] [place], [place] [address]  Respiratory Panel by RT PCR (Flu A&B, Covid) - Nasopharyngeal Swab     Status: None   Collection Time: 03/29/20 10:40 AM   Specimen: Nasopharyngeal Swab  Result Value Ref Range Status   SARS Coronavirus 2 by RT PCR NEGATIVE NEGATIVE Final    Comment: (NOTE) SARS-CoV-2 target nucleic acids are NOT DETECTED.  The SARS-CoV-2 RNA is generally detectable in upper respiratoy specimens during the acute phase of infection. The lowest concentration of SARS-CoV-2 viral copies this assay can detect is 131 copies/mL. A negative result does not preclude SARS-Cov-2 infection and should not be used as the sole basis for treatment or other patient management decisions. A negative result may occur with  improper specimen collection/handling, submission of specimen  other than nasopharyngeal swab, presence of viral mutation(s) within the areas targeted by this assay, and inadequate number of viral copies (<131 copies/mL). A negative result must be combined with clinical observations, patient history, and epidemiological information. The expected result is Negative.  Fact Sheet for Patients:  https://www.moore.com/https://www.fda.gov/media/142436/download  Fact Sheet for Healthcare Providers:  https://www.young.biz/https://www.fda.gov/media/142435/download  This test is no t yet approved or cleared by the [place] FDA and  has been authorized for detection and/or diagnosis of SARS-CoV-2 by FDA under an Emergency Use Authorization (EUA). This EUA will remain  in effect (meaning this test can be used) for the duration of the COVID-19 declaration under Section 564(b)(1) of the Act, 21 U.S.C. section 360bbb-3(b)(1), unless the authorization is terminated or revoked sooner.     Influenza A by PCR NEGATIVE NEGATIVE Final   Influenza B by PCR NEGATIVE NEGATIVE Final    Comment: (NOTE) The Xpert Xpress SARS-CoV-2/FLU/RSV assay is intended as an aid in  the diagnosis of influenza from Nasopharyngeal swab specimens and  should not be used as a sole basis for treatment. Nasal washings and  aspirates are unacceptable for Xpert Xpress SARS-CoV-2/FLU/RSV  testing.  Fact Sheet for Patients: https://www.moore.com/https://www.fda.gov/media/142436/download  Fact Sheet for Healthcare Providers: https://www.young.biz/https://www.fda.gov/media/142435/download  This test is not yet approved or cleared by the [place] FDA and  has been authorized for detection and/or diagnosis of SARS-CoV-2 by  FDA under an Emergency Use Authorization (EUA). This EUA will remain  in effect (meaning this test can be used) for the duration of the  Covid-19 declaration under Section 564(b)(1) of the Act, 21  U.S.C. section 360bbb-3(b)(1), unless the authorization is  terminated or revoked. Performed at [facility], [address] [place], [place] [address]   Urine Culture     Status: None   Collection Time: 03/29/20  1:08 PM   Specimen: Urine, Random  Result Value Ref Range Status   Specimen Description   Final    URINE, RANDOM Performed at [facility], [address] [place], [place] [address]    Special Requests   Final    NONE Performed at [facility], [address] [place], [place] [address]    Culture   Final    NO GROWTH Performed at [facility], [address] [place], [place] [address]  Report Status 03/31/2020 FINAL  Final  Blood Culture (routine x 2)     Status: None (Preliminary result)   Collection Time: 04/01/20  4:21 PM   Specimen: BLOOD  Result Value Ref Range Status   Specimen Description   Final    BLOOD RIGHT ANTECUBITAL Performed at [facility], [address] [place], [place] [address]    Special Requests   Final    BOTTLES DRAWN AEROBIC AND ANAEROBIC Blood Culture adequate volume Performed at [facility], [address], [place], [place] [address]    Culture   Final    NO GROWTH < 12 HOURS Performed at [facility], [address] [place], [place] [address]    Report Status PENDING  Incomplete  Blood Culture (routine x 2)     Status: None (Preliminary result)   Collection Time: 04/01/20  4:26 PM   Specimen: BLOOD  Result Value Ref Range Status   Specimen Description   Final    BLOOD RIGHT WRIST Performed at [facility], [address], [place], [place] [address]    Special Requests   Final    BOTTLES DRAWN AEROBIC AND ANAEROBIC Blood Culture results may not be optimal due to an excessive volume of blood received in culture bottles Performed at [facility], [address] [place], [place] [address]    Culture   Final    NO GROWTH < 12 HOURS Performed at [facility], [address] [place], [place] [address]    Report  Status PENDING  Incomplete  Respiratory Panel by RT PCR (Flu A&B, Covid) - Nasopharyngeal Swab     Status: None   Collection Time: 04/01/20  6:55 PM   Specimen: Nasopharyngeal Swab  Result Value Ref Range Status   SARS Coronavirus 2 by RT PCR NEGATIVE NEGATIVE Final    Comment: (NOTE) SARS-CoV-2 target nucleic acids are NOT DETECTED.  The SARS-CoV-2 RNA is generally detectable in upper respiratoy specimens during the acute phase of infection. The lowest concentration of SARS-CoV-2 viral copies this assay can detect is 131 copies/mL. A negative result does not preclude SARS-Cov-2 infection and should not be used as the sole basis for treatment or other patient management decisions. A negative result may occur with  improper specimen collection/handling, submission of specimen other than nasopharyngeal swab, presence of viral mutation(s) within the areas targeted by this assay, and inadequate number of viral copies (<131 copies/mL). A negative result must be combined with clinical observations, patient history, and epidemiological information. The expected result is Negative.  Fact Sheet for Patients:  https://www.moore.com/  Fact Sheet for Healthcare Providers:  https://www.young.biz/  This test is no t yet approved or cleared by the [place] FDA and  has been authorized for detection and/or diagnosis of SARS-CoV-2 by FDA under an Emergency Use Authorization (EUA). This EUA will remain  in effect (meaning this test can be used) for the duration of the COVID-19 declaration under Section 564(b)(1) of the Act, 21 U.S.C. section 360bbb-3(b)(1), unless the authorization is terminated or revoked sooner.     Influenza A by PCR NEGATIVE NEGATIVE Final   Influenza B by PCR NEGATIVE NEGATIVE Final    Comment: (NOTE) The Xpert Xpress SARS-CoV-2/FLU/RSV assay is intended as an aid in  the diagnosis of influenza from Nasopharyngeal swab specimens  and  should not be used as a sole basis for treatment. Nasal washings and  aspirates are unacceptable for Xpert Xpress  SARS-CoV-2/FLU/RSV  testing.  Fact Sheet for Patients: https://www.moore.com/  Fact Sheet for Healthcare Providers: https://www.young.biz/  This test is not yet approved or cleared by the [place] FDA and  has been authorized for detection and/or diagnosis of SARS-CoV-2 by  FDA under an Emergency Use Authorization (EUA). This EUA will remain  in effect (meaning this test can be used) for the duration of the  Covid-19 declaration under Section 564(b)(1) of the Act, 21  U.S.C. section 360bbb-3(b)(1), unless the authorization is  terminated or revoked. Performed at [facility], [address] [place], [place] [address]       Studies: CT Soft Tissue Neck W Contrast  Result Date: 04/01/2020 CLINICAL DATA:  19 year old female with sore throat for 6 days. Jaw and neck pain. Tested negative for strep and COVID-19. EXAM: CT NECK WITH CONTRAST TECHNIQUE: Multidetector CT imaging of the neck was performed using the standard protocol following the bolus administration of intravenous contrast. CONTRAST:  85mL OMNIPAQUE IOHEXOL 300 MG/ML  SOLN COMPARISON:  None. FINDINGS: Pharynx and larynx: Fairly extensive right parapharyngeal space edema, soft tissue inflammation. The larynx is least affected. At the right palatine tonsil there is a relatively large although indistinct round hypodense area measuring up to 2.8 cm diameter (series 2, image 28 and sagittal image 37) consistent with right tonsillar abscess. The left parapharyngeal space remains normal. There is a small retropharyngeal effusion throughout. Salivary glands: Secondary inflammation also in the right submandibular space. The sublingual space is relatively spared at this time. Mildly heterogeneous enhancement of the right submandibular gland compared to the left.  Parotid glands remain within normal limits. Thyroid: Negative. Lymph nodes: Reactive appearing bilateral cervical lymphadenopathy. Enhancing nodes on the right measure up to 16 mm short axis individually. No cystic or necrotic nodes. Vascular: The major vascular structures in the neck and at the skull base remain patent including the right IJ. Limited intracranial: Negative. Visualized orbits: Negative. Mastoids and visualized paranasal sinuses: Mild bubbly opacity in the left sphenoid sinus. Minor right maxillary mucosal thickening. Well aerated sinuses otherwise. Tympanic cavities and visible mastoids are clear. Skeleton: No acute dental finding. No osseous abnormality identified. Upper chest: Negative. IMPRESSION: 1. Positive for a large Right Tonsillar Abscess, up to 2.8 cm diameter. 2. Widespread right parapharyngeal inflammation. Small retropharyngeal effusion. Secondary inflammation in the right submandibular space also. 3. Right greater than left reactive cervical lymphadenopathy. Electronically Signed   By: Odessa Fleming M.D.   On: 04/01/2020 18:34   DG Chest Port 1 View  Result Date: 04/01/2020 CLINICAL DATA:  19 year old female with concern for sepsis. EXAM: PORTABLE CHEST 1 VIEW COMPARISON:  Chest radiograph dated 12/18/2019. FINDINGS: The heart size and mediastinal contours are within normal limits. Both lungs are clear. The visualized skeletal structures are unremarkable. IMPRESSION: No active disease. Electronically Signed   By: Elgie Collard M.D.   On: 04/01/2020 16:56    Scheduled Meds:  dexamethasone (DECADRON) injection  8 mg Intravenous Q8H   [START ON 04/03/2020] pneumococcal 23 valent vaccine  0.5 mL Intramuscular Tomorrow-1000    Continuous Infusions:  clindamycin (CLEOCIN) IV 600 mg (04/02/20 1256)   lactated ringers 100 mL/hr at 04/02/20 1255     LOS: 1 day     Darlin Drop, MD [facility] Pager [phone]  If 7PM-7AM, please contact  night-coverage [email or web address] Password [email or web address] 04/02/2020, 1:28 PM

## 2020-04-02 NOTE — Progress Notes (Signed)
Initial Nutrition Assessment  DOCUMENTATION CODES:   Not applicable  INTERVENTION:  BSE recommended d/t reported swallowing difficulties with thin liquids  If fluid consistency appropriate, will order Boost Breeze po TID, each supplement provides 250 kcal and 9 grams of protein  Once diet advances, will provide Magic cup TID with meals, each supplement provides 290 kcal and 9 grams of protein  Pt is at risk for refeeding, recommend monitoring magnesium, potassium, and phosphorus daily as diet advances and po intake improves, MD to replete as needed  NUTRITION DIAGNOSIS:   Inadequate oral intake related to dysphagia, acute illness (tonsillar abscess) as evidenced by per patient/family report (inability to tolerate oral intake d/t pain with swallowing).    GOAL:   Patient will meet greater than or equal to 90% of their needs    MONITOR:   PO intake, Supplement acceptance, Diet advancement, I & O's, Labs, Weight trends  REASON FOR ASSESSMENT:   Malnutrition Screening Tool    ASSESSMENT:  RD working remotely.  19 year old female with history of sickle cell anemia followed by hematology admitted for tonsillar abscess. Pt presents with 1 week of worsening sore throat, diminished po intake due to pain, fevers, and some nausea with vomiting despite oupt antibiotic treatment.  Spoke with pt via phone, reports feeling some better today. She endorsed swallowing difficulties when drinking water this morning. Pt states "it felt weird and thought I might choke" RD has messaged MD via secure chat and requested consideration of BSE. She reports having a good appetite at baseline, however no oral intake in the last 3 days d/t pain with swallowing. Pt recalls a few bites of a Mango popscicle last night, says it tasted good, but then began to feel nauseas and unable to finish. She is agreeable to trying Boost Breeze supplements to aid with meeting needs as well as Magic Cup with diet  advancement. Will hold on ordering Boost Breeze at this time given reported swallowing difficulties with thin liquids.   Weight history reviewed,  Medications reviewed and include: Clindamycin  LR @ 100 ml/hr  Labs: K 3.4 (L), WBC 28.5 (H)   NUTRITION - FOCUSED PHYSICAL EXAM: Unable to complete at this time, RD working remotely.    Diet Order:   Diet Order            Diet clear liquid Room service appropriate? Yes; Fluid consistency: Thin  Diet effective now                 EDUCATION NEEDS:   Education needs have been addressed  Skin:  Skin Assessment: Reviewed RN Assessment  Last BM:  11/10  Height:   Ht Readings from Last 1 Encounters:  03/29/20 5\' 5"  (1.651 m) (61 %, Z= 0.27)*   * Growth percentiles are based on CDC (Girls, 2-20 Years) data.    Weight:   Wt Readings from Last 1 Encounters:  03/29/20 59 kg (53 %, Z= 0.08)*   * Growth percentiles are based on CDC (Girls, 2-20 Years) data.   Patient is of normal weight for age based on growth percentile.  Estimated Nutritional Needs:   Kcal:  2200-2400  Protein:  50-65  Fluid:  >/= 2L/day   13/08/21, RD, LDN Clinical Nutrition After Hours/Weekend Pager # in Amion

## 2020-04-03 LAB — BASIC METABOLIC PANEL
Anion gap: 7 (ref 5–15)
BUN: 8 mg/dL (ref 6–20)
CO2: 23 mmol/L (ref 22–32)
Calcium: 8.7 mg/dL — ABNORMAL LOW (ref 8.9–10.3)
Chloride: 105 mmol/L (ref 98–111)
Creatinine, Ser: 0.48 mg/dL (ref 0.44–1.00)
GFR, Estimated: >60 mL/min (ref >60)
Glucose, Bld: 152 mg/dL — ABNORMAL HIGH (ref 70–99)
Potassium: 3.9 mmol/L (ref 3.5–5.1)
Sodium: 135 mmol/L (ref 135–145)

## 2020-04-03 LAB — CBC WITH DIFFERENTIAL/PLATELET
Abs Immature Granulocytes: 0.89 10*3/uL — ABNORMAL HIGH (ref 0.00–0.07)
Basophils Absolute: 0.1 10*3/uL (ref 0.0–0.1)
Basophils Relative: 0 %
Eosinophils Absolute: 0 10*3/uL (ref 0.0–0.5)
Eosinophils Relative: 0 %
HCT: 25.3 % — ABNORMAL LOW (ref 36.0–46.0)
Hemoglobin: 9.1 g/dL — ABNORMAL LOW (ref 12.0–15.0)
Immature Granulocytes: 5 %
Lymphocytes Relative: 8 %
Lymphs Abs: 1.7 10*3/uL (ref 0.7–4.0)
MCH: 27.3 pg (ref 26.0–34.0)
MCHC: 36 g/dL (ref 30.0–36.0)
MCV: 76 fL — ABNORMAL LOW (ref 80.0–100.0)
Monocytes Absolute: 0.7 10*3/uL (ref 0.1–1.0)
Monocytes Relative: 3 %
Neutro Abs: 16.4 10*3/uL — ABNORMAL HIGH (ref 1.7–7.7)
Neutrophils Relative %: 84 %
Platelets: 215 10*3/uL (ref 150–400)
RBC: 3.33 MIL/uL — ABNORMAL LOW (ref 3.87–5.11)
RDW: 15.7 % — ABNORMAL HIGH (ref 11.5–15.5)
WBC: 19.7 10*3/uL — ABNORMAL HIGH (ref 4.0–10.5)
nRBC: 0.1 % (ref 0.0–0.2)

## 2020-04-03 LAB — PHOSPHORUS: Phosphorus: 3.6 mg/dL (ref 2.5–4.6)

## 2020-04-03 LAB — MAGNESIUM: Magnesium: 2 mg/dL (ref 1.7–2.4)

## 2020-04-03 MED ORDER — BOOST / RESOURCE BREEZE PO LIQD CUSTOM
1.0000 | Freq: Three times a day (TID) | ORAL | Status: DC
  Administered 2020-04-03 – 2020-04-04 (×5): 1 via ORAL

## 2020-04-03 MED ORDER — FERROUS SULFATE 325 (65 FE) MG PO TABS
325.0000 mg | ORAL_TABLET | Freq: Every day | ORAL | Status: DC
  Administered 2020-04-03 – 2020-04-04 (×2): 325 mg via ORAL
  Filled 2020-04-03 (×2): qty 1

## 2020-04-03 NOTE — Progress Notes (Signed)
PROGRESS NOTE  Ashley Hawkins JXB:[id number] DOB: March 06, 2001 DOA: 04/01/2020 PCP: Ladell Pier, MD  HPI/Recap of past 24 hours: Ashley Hawkins is a 19 y.o. female with medical history of sickle cell trait.  She presents with complaint of fever and sore throat.  She reports symptoms began a week ago.  She was seen in an urgent care and had a negative strep test and was placed on amoxicillin.  A few days later she continued to have sore throat and difficulty eating and had developed some nausea and vomiting was seen in the ED at [facility].  At that time strep test was again performed and was negative and Covid swab was negative.  She was changed from amoxicillin to Keflex.  She continued to have fevers that were greater than 102 degrees.  She had nausea, vomiting, and was not able to tolerate p.o. intake.  Reported worsening pain with swallowing.  Pain is in the right side of her throat, difficulty opening her mouth open her mouth secondary to the pain.  Denies breathing difficulty.    ED Course:   R tonsillar abscess on CT of her neck, WBCs greater than 26,000.  ER provider discussed with ENT who stated that since it was a tonsillar abscess that should be amendable to treatment with antibiotic and not require surgery.  There is no peritonsillar involvement on the CT scan.  Hospital service asked to admit for further management.  Met SIRS criteria with tachycardia, tachypnea, and leukocytosis.  Received IV fluids and broad-spectrum IV antibiotics in the ED, Zosyn and IV vancomycin.   Discussed with ENT, Dr. Fredric Dine on 04/02/20 who recommended to add to IV clindamycin, IV Decadron 8 mg TID x 3 doses.  04/03/20: Swelling is improved.  She is now able to swallow more comfortably.  Started on regular diet.  Will continue IV abx and IV fluid over the week-end.  Likely will dc on Monday 04/05/20 once sepsis has resolved.  WBC down trending but still elevated at 19,000 from  28,000.  Assessment/Plan: Principal Problem:   Tonsillar abscess Active Problems:   Fever   Leukocytosis   SIRS (systemic inflammatory response syndrome) (HCC)  Sepsis, improving, secondary to R tonsillar abscess seen on CT scan soft tissue neck. Presented with leukocytosis, WBC 28.5K, tachycardia and tachypnea, with R tonsillar abscess on CT scan. Blood cultures taken on 04/01/2020 - to date. Discussed with ENT on 04/02/2020, Dr. Fredric Dine, recommended management with IV antibiotics, clindamycin 600 or 900 mg Q8H.  Add IV Decadron 8 mg every 8 hours x3 doses for swelling on 11/12, completed. Pain control as needed.  Continue IV fluid Start reg diet on 04/03/20, advanced from clear liquid diet.  Large right tonsillar abscess up to 2.8 cm in diameter CT soft tissue neck with contrast done on 04/01/2020 revealed: 1. Positive for a large Right Tonsillar Abscess, up to 2.8 cm diameter. 2. Widespread right parapharyngeal inflammation. Small retropharyngeal effusion. Secondary inflammation in the right submandibular space also. 3. Right greater than left reactive cervical lymphadenopathy. Continue IV clindamycin as stated above  Steroid-induced hyperglycemia Received 3 doses of IV Decadron 8 mg on 04/02/2020 Serum glucose 152 on 04/03/2020 Monitor for now  Resolved post repletion: Hypokalemia Serum potassium 3.4>> 3.9 on 04/03/2020 Repleted intravenously. Repeat BMP in the morning.  Microcytic anemia, chronic Presented with hemoglobin 10.9 with MCV of 76. Hemoglobin 9.6 on 04/02/2020, 9.1 on 04/03/2020 Start ferrous sulfate 325 mg daily  Acute blood loss anemia in the setting  of menses She is currently in her menses Monitor H&H  History of sickle cell trait No acute issues    Code Status: Full code.  Family Communication: Updated her mother at bedside on 04/03/2020.  Disposition Plan: Likely will DC to home once sepsis is resolved.  Consultants:  Curb sided with  ENT Dr. Fredric Dine on 04/02/2020.  Procedures:  None.  Antimicrobials:  IV clindamycin.  DVT prophylaxis: SCDs.  Status is: Inpatient    Dispo:  Patient From: Home  Planned Disposition: Home  Expected discharge date: 04/05/20  Medically stable for discharge: No, ongoing management of large right tonsillar abscess.         Objective: Vitals:   04/02/20 2119 04/03/20 0531 04/03/20 0908 04/03/20 1353  BP: 125/84 116/69 110/69 (!) 104/59  Pulse: 91 80 65 79  Resp: 16 16 16 20   Temp: 98.3 F (36.8 C) 98 F (36.7 C) 97.9 F (36.6 C) 98.4 F (36.9 C)  TempSrc: Oral Oral Oral Oral  SpO2: 100% 100% (!) 86% 100%    Intake/Output Summary (Last 24 hours) at 04/03/2020 1533 Last data filed at 04/03/2020 1400 Gross per 24 hour  Intake 2837.95 ml  Output --  Net 2837.95 ml   There were no vitals filed for this visit.  Exam:  . General: 19 y.o. year-old female pleasant well-developed well-nourished no acute stress.  Alert oriented x3. . Cardiovascular: Regular rate and rhythm no rubs or gallops.  Marland Kitchen Respiratory: Clear to auscultation no wheezes or rales.  Right submandibular swelling much improved. . Abdomen: Soft nontender normal bowel sounds present. . Musculoskeletal: No lower extremity edema bilaterally.   Marland Kitchen Psychiatry: Mood is appropriate for condition and setting.   Data Reviewed: CBC: Recent Labs  Lab 03/29/20 1148 04/01/20 1355 04/02/20 0245 04/02/20 0630 04/03/20 0632  WBC 19.9* 26.3* 28.5*  --  19.7*  NEUTROABS 15.1* 20.4*  --   --  16.4*  HGB 10.9* 10.4* 8.2* 9.6* 9.1*  HCT 29.8* 28.6* 22.8* 26.4* 25.3*  MCV 76.2* 77.1* 77.0*  --  76.0*  PLT 159 204 153  --  409   Basic Metabolic Panel: Recent Labs  Lab 03/29/20 1148 04/01/20 1355 04/02/20 0245 04/03/20 0632  NA 137 136 135 135  K 3.1* 3.5 3.4* 3.9  CL 99 102 103 105  CO2 24 23 24 23   GLUCOSE 100* 103* 104* 152*  BUN 10 9 <5* 8  CREATININE 0.40* 0.59 0.46 0.48  CALCIUM 9.4 9.0  8.2* 8.7*  MG  --   --   --  2.0  PHOS  --   --   --  3.6   GFR: Estimated Creatinine Clearance: 101.8 mL/min (by C-G formula based on SCr of 0.48 mg/dL). Liver Function Tests: Recent Labs  Lab 03/29/20 1148 04/01/20 1355  AST 16 16  ALT 18 24  ALKPHOS 69 57  BILITOT 3.8* 2.7*  PROT 8.8* 8.9*  ALBUMIN 4.6 4.0   Recent Labs  Lab 03/29/20 1148  LIPASE 27   No results for input(s): AMMONIA in the last 168 hours. Coagulation Profile: Recent Labs  Lab 04/01/20 1621  INR 1.3*   Cardiac Enzymes: No results for input(s): CKTOTAL, CKMB, CKMBINDEX, TROPONINI in the last 168 hours. BNP (last 3 results) No results for input(s): PROBNP in the last 8760 hours. HbA1C: No results for input(s): HGBA1C in the last 72 hours. CBG: No results for input(s): GLUCAP in the last 168 hours. Lipid Profile: No results for input(s): CHOL, HDL, LDLCALC, TRIG,  CHOLHDL, LDLDIRECT in the last 72 hours. Thyroid Function Tests: No results for input(s): TSH, T4TOTAL, FREET4, T3FREE, THYROIDAB in the last 72 hours. Anemia Panel: Recent Labs    04/01/20 1355  RETICCTPCT 4.9*   Urine analysis:    Component Value Date/Time   COLORURINE YELLOW 04/01/2020 1644   APPEARANCEUR HAZY (A) 04/01/2020 1644   LABSPEC 1.024 04/01/2020 1644   PHURINE 5.0 04/01/2020 1644   GLUCOSEU NEGATIVE 04/01/2020 1644   HGBUR LARGE (A) 04/01/2020 1644   BILIRUBINUR NEGATIVE 04/01/2020 1644   KETONESUR 80 (A) 04/01/2020 1644   PROTEINUR 100 (A) 04/01/2020 1644   UROBILINOGEN 1.0 10/12/2008 0145   NITRITE NEGATIVE 04/01/2020 1644   LEUKOCYTESUR NEGATIVE 04/01/2020 1644   Sepsis Labs: @LABRCNTIP (procalcitonin:4,lacticidven:4)  ) Recent Results (from the past 240 hour(s))  Group A Strep by PCR     Status: None   Collection Time: 03/26/20  4:48 PM   Specimen: Throat; Sterile Swab  Result Value Ref Range Status   Group A Strep by PCR NOT DETECTED NOT DETECTED Final    Comment: Performed at [facility], [place] [address] [place], [place] [address]  Respiratory Panel by RT PCR (Flu A&B, Covid) - Nasopharyngeal Swab     Status: None   Collection Time: 03/29/20 10:40 AM   Specimen: Nasopharyngeal Swab  Result Value Ref Range Status   SARS Coronavirus 2 by RT PCR NEGATIVE NEGATIVE Final    Comment: (NOTE) SARS-CoV-2 target nucleic acids are NOT DETECTED.  The SARS-CoV-2 RNA is generally detectable in upper respiratoy specimens during the acute phase of infection. The lowest concentration of SARS-CoV-2 viral copies this assay can detect is 131 copies/mL. A negative result does not preclude SARS-Cov-2 infection and should not be used as the sole basis for treatment or other patient management decisions. A negative result may occur with  improper specimen collection/handling, submission of specimen other than nasopharyngeal swab, presence of viral mutation(s) within the areas targeted by this assay, and inadequate number of viral copies (<131 copies/mL). A negative result must be combined with clinical observations, patient history, and epidemiological information. The expected result is Negative.  Fact Sheet for Patients:  PinkCheek.be  Fact Sheet for Healthcare Providers:  GravelBags.it  This test is no t yet approved or cleared by the [place] FDA and  has been authorized for detection and/or diagnosis of SARS-CoV-2 by FDA under an Emergency Use Authorization (EUA). This EUA will remain  in effect (meaning this test can be used) for the duration of the COVID-19 declaration under Section 564(b)(1) of the Act, 21 U.S.C. section 360bbb-3(b)(1), unless the authorization is terminated or revoked sooner.     Influenza A by PCR NEGATIVE NEGATIVE Final   Influenza B by PCR NEGATIVE NEGATIVE Final    Comment: (NOTE) The Xpert Xpress SARS-CoV-2/FLU/RSV assay is intended as an aid in  the diagnosis of influenza from Nasopharyngeal  swab specimens and  should not be used as a sole basis for treatment. Nasal washings and  aspirates are unacceptable for Xpert Xpress SARS-CoV-2/FLU/RSV  testing.  Fact Sheet for Patients: PinkCheek.be  Fact Sheet for Healthcare Providers: GravelBags.it  This test is not yet approved or cleared by the [place] FDA and  has been authorized for detection and/or diagnosis of SARS-CoV-2 by  FDA under an Emergency Use Authorization (EUA). This EUA will remain  in effect (meaning this test can be used) for the duration of the  Covid-19 declaration under Section 564(b)(1) of the Act, 21  U.S.C. section 360bbb-3(b)(1), unless the authorization is  terminated or revoked. Performed at [facility], [place] [address] [place], [place] [address]   Urine Culture     Status: None   Collection Time: 03/29/20  1:08 PM   Specimen: Urine, Random  Result Value Ref Range Status   Specimen Description   Final    URINE, RANDOM Performed at [facility] [address] [place], [place] [address]    Special Requests   Final    NONE Performed at [facility], [place] [address] [place], [place] [address]    Culture   Final    NO GROWTH Performed at [facility], [place] [address] [place], [place] [address]    Report Status 03/31/2020 FINAL  Final  Blood Culture (routine x 2)     Status: None (Preliminary result)   Collection Time: 04/01/20  4:21 PM   Specimen: BLOOD  Result Value Ref Range Status   Specimen Description   Final    BLOOD RIGHT ANTECUBITAL Performed at [facility] [address] [place], [place] [address]    Special Requests   Final    BOTTLES DRAWN AEROBIC AND ANAEROBIC Blood Culture adequate volume Performed at [facility] [address] [place], [place] [address]    Culture   Final    NO GROWTH 2 DAYS Performed at  [facility] [address], [place], [place] [address]    Report Status PENDING  Incomplete  Blood Culture (routine x 2)     Status: None (Preliminary result)   Collection Time: 04/01/20  4:26 PM   Specimen: BLOOD  Result Value Ref Range Status   Specimen Description   Final    BLOOD RIGHT WRIST Performed at [facility] [address], [place], [place] [address]    Special Requests   Final    BOTTLES DRAWN AEROBIC AND ANAEROBIC Blood Culture results may not be optimal due to an excessive volume of blood received in culture bottles Performed at [facility] [address], [place], [place] [address]    Culture   Final    NO GROWTH 2 DAYS Performed at [facility] [address], [place], [place] [address]    Report Status PENDING  Incomplete  Urine culture     Status: None   Collection Time: 04/01/20  4:44 PM   Specimen: In/Out Cath Urine  Result Value Ref Range Status   Specimen Description   Final    IN/OUT CATH URINE Performed at [facility] [address] [place], [place] [address]    Special Requests   Final    NONE Performed at [facility], [place] [address] [place], [place] [address]    Culture   Final    NO GROWTH Performed at [facility], [place] [address] [place],  [address]    Report Status 04/02/2020 FINAL  Final  Respiratory Panel by RT PCR (Flu A&B, Covid) - Nasopharyngeal Swab     Status: None   Collection Time: 04/01/20  6:55 PM   Specimen: Nasopharyngeal Swab  Result Value Ref Range Status   SARS Coronavirus 2 by RT PCR NEGATIVE NEGATIVE Final    Comment: (NOTE) SARS-CoV-2 target nucleic acids are NOT DETECTED.  The SARS-CoV-2 RNA is generally detectable in upper respiratoy specimens during the acute phase of infection. The lowest concentration of SARS-CoV-2  viral copies this assay can detect is 131 copies/mL. A negative result does not preclude  SARS-Cov-2 infection and should not be used as the sole basis for treatment or other patient management decisions. A negative result may occur with  improper specimen collection/handling, submission of specimen other than nasopharyngeal swab, presence of viral mutation(s) within the areas targeted by this assay, and inadequate number of viral copies (<131 copies/mL). A negative result must be combined with clinical observations, patient history, and epidemiological information. The expected result is Negative.  Fact Sheet for Patients:  PinkCheek.be  Fact Sheet for Healthcare Providers:  GravelBags.it  This test is no t yet approved or cleared by the [place] FDA and  has been authorized for detection and/or diagnosis of SARS-CoV-2 by FDA under an Emergency Use Authorization (EUA). This EUA will remain  in effect (meaning this test can be used) for the duration of the COVID-19 declaration under Section 564(b)(1) of the Act, 21 U.S.C. section 360bbb-3(b)(1), unless the authorization is terminated or revoked sooner.     Influenza A by PCR NEGATIVE NEGATIVE Final   Influenza B by PCR NEGATIVE NEGATIVE Final    Comment: (NOTE) The Xpert Xpress SARS-CoV-2/FLU/RSV assay is intended as an aid in  the diagnosis of influenza from Nasopharyngeal swab specimens and  should not be used as a sole basis for treatment. Nasal washings and  aspirates are unacceptable for Xpert Xpress SARS-CoV-2/FLU/RSV  testing.  Fact Sheet for Patients: PinkCheek.be  Fact Sheet for Healthcare Providers: GravelBags.it  This test is not yet approved or cleared by the [place] FDA and  has been authorized for detection and/or diagnosis of SARS-CoV-2 by  FDA under an Emergency Use Authorization (EUA). This EUA will remain  in effect (meaning this test can be used) for the duration of the   Covid-19 declaration under Section 564(b)(1) of the Act, 21  U.S.C. section 360bbb-3(b)(1), unless the authorization is  terminated or revoked. Performed at [facility], [place] [address] [place], [place] [address]       Studies: No results found.  Scheduled Meds: . feeding supplement  1 Container Oral TID BM  . ferrous sulfate  325 mg Oral Q breakfast  . pneumococcal 23 valent vaccine  0.5 mL Intramuscular Tomorrow-1000    Continuous Infusions: . clindamycin (CLEOCIN) IV 600 mg (04/03/20 1304)  . lactated ringers 50 mL/hr at 04/03/20 1110     LOS: 2 days     Kayleen Memos, MD [facility] Pager [phone]  If 7PM-7AM, please contact night-coverage [email or web address] Password [facility] 04/03/2020, 3:33 PM

## 2020-04-04 LAB — CBC WITH DIFFERENTIAL/PLATELET
Abs Immature Granulocytes: 0.85 10*3/uL — ABNORMAL HIGH (ref 0.00–0.07)
Basophils Absolute: 0.1 10*3/uL (ref 0.0–0.1)
Basophils Relative: 0 %
Eosinophils Absolute: 0.1 10*3/uL (ref 0.0–0.5)
Eosinophils Relative: 1 %
HCT: 23.2 % — ABNORMAL LOW (ref 36.0–46.0)
Hemoglobin: 8.4 g/dL — ABNORMAL LOW (ref 12.0–15.0)
Immature Granulocytes: 4 %
Lymphocytes Relative: 24 %
Lymphs Abs: 4.6 10*3/uL — ABNORMAL HIGH (ref 0.7–4.0)
MCH: 27.7 pg (ref 26.0–34.0)
MCHC: 36.2 g/dL — ABNORMAL HIGH (ref 30.0–36.0)
MCV: 76.6 fL — ABNORMAL LOW (ref 80.0–100.0)
Monocytes Absolute: 1.1 10*3/uL — ABNORMAL HIGH (ref 0.1–1.0)
Monocytes Relative: 6 %
Neutro Abs: 12.4 10*3/uL — ABNORMAL HIGH (ref 1.7–7.7)
Neutrophils Relative %: 65 %
Platelets: 196 10*3/uL (ref 150–400)
RBC: 3.03 MIL/uL — ABNORMAL LOW (ref 3.87–5.11)
RDW: 16.1 % — ABNORMAL HIGH (ref 11.5–15.5)
WBC: 19.2 10*3/uL — ABNORMAL HIGH (ref 4.0–10.5)
nRBC: 0.4 % — ABNORMAL HIGH (ref 0.0–0.2)

## 2020-04-04 MED ORDER — SENNOSIDES-DOCUSATE SODIUM 8.6-50 MG PO TABS: 2.0000 | ORAL_TABLET | Freq: Two times a day (BID) | ORAL | Status: DC

## 2020-04-04 MED ORDER — FERROUS SULFATE 325 (65 FE) MG PO TABS: 325.0000 mg | ORAL_TABLET | Freq: Two times a day (BID) | ORAL | Status: DC

## 2020-04-04 MED ORDER — FERROUS SULFATE 325 (65 FE) MG PO TABS
325.0000 mg | ORAL_TABLET | Freq: Every day | ORAL | Status: DC
  Administered 2020-04-05 – 2020-04-07 (×3): 325 mg via ORAL
  Filled 2020-04-04 (×3): qty 1

## 2020-04-04 MED ORDER — SODIUM CHLORIDE 0.9 % IV SOLN
510.0000 mg | Freq: Once | INTRAVENOUS | Status: AC
  Administered 2020-04-04: 510 mg via INTRAVENOUS
  Filled 2020-04-04: qty 510

## 2020-04-04 NOTE — Progress Notes (Signed)
PROGRESS NOTE  Ashley Hawkins DPO:[id number] DOB: 03-Mar-2001 DOA: 04/01/2020 PCP: Ladell Pier, MD  HPI/Recap of past 24 hours: Ashley Hawkins is a 19 y.o. female with medical history of sickle cell trait.  She presents with complaint of fever and sore throat x 1 week.  She was seen in an urgent care and had a negative strep test, was placed on amoxicillin.  A few days later she continued to have sore throat and difficulty eating and had developed some nausea and vomiting was seen in the ED at [facility].  At that time strep test was again performed and was negative.  Covid swab was negative.  She was changed from amoxicillin to Keflex.  She continued to have fevers that were greater than 102 degrees at home.  She had nausea, vomiting, and was not able to tolerate p.o. intake.  Associated with worsening pain with swallowing.  Pain was mainly in the right side of her throat.  She also had difficulty opening her mouth secondary to the pain.  No breathing difficulty.    ED Course:   R tonsillar abscess on CT of her neck, WBCs greater than 26,000.  ER provider discussed with ENT who stated that since it was a tonsillar abscess that should be amendable to treatment with antibiotic and not require surgery.  Hospital service asked to admit for further management.  Met SIRS criteria with tachycardia, tachypnea, and leukocytosis.  Received IV fluids and broad-spectrum IV antibiotics in the ED, Zosyn and IV vancomycin.   Discussed with ENT, Dr. Fredric Dine on 04/02/20 who recommended to continue IV clindamycin and to add IV Decadron 8 mg TID x 3 doses.  Her R submandibular edema has significantly improved post IV Decadron.  She has been able to tolerate a regular constency diet ssince 04/03/20.  She remains on IV Clindamycin due to persistent leukocytosis WBC> 19,000 with neutrophilia.  04/03/20: Feels better this am.  Has her menses with drop in Hg 8.4, MCV 76.  IV Feraheme ordered to be infused, 510 mg x 1  dose.  Obtained consent from her mother via phone.  Assessment/Plan: Principal Problem:   Tonsillar abscess Active Problems:   Fever   Leukocytosis   SIRS (systemic inflammatory response syndrome) (HCC)  Sepsis, improving, secondary to R tonsillar abscess seen on CT scan soft tissue neck. Presented with leukocytosis, WBC 28.5K, tachycardia and tachypnea, with R tonsillar abscess on CT scan. Blood cultures taken on 04/01/2020 - to date. Discussed with ENT on 04/02/2020, Dr. Fredric Dine, recommended management with IV antibiotics, IV clindamycin 600 or 900 mg Q8H.  Added IV Decadron 8 mg every 8 hours x3 doses for swelling on 11/12, completed. Pain control as needed.  Continue IV fluid Reg diet on 04/03/20, advanced from clear liquid diet.  Tolerating regular consistency diet well. WBC 19,000 with neutrophilia, continue IV abx, repeat CBC w diff in the AM.  Large right tonsillar abscess up to 2.8 cm in diameter CT soft tissue neck with contrast done on 04/01/2020 revealed: 1. Positive for a large Right Tonsillar Abscess, up to 2.8 cm diameter. 2. Widespread right parapharyngeal inflammation. Small retropharyngeal effusion. Secondary inflammation in the right submandibular space also. 3. Right greater than left reactive cervical lymphadenopathy. Continue IV clindamycin as stated above  Acute blood loss anemia in the setting of menses Microcytic anemia, chronic Presented with hemoglobin 10.9 with MCV of 76. Hemoglobin 9.6 on 04/02/2020, Hg 9.1 on 04/03/2020, Hg 8.4 on 04/04/20 Continue ferrous sulfate 325 mg  daily Added IV Feraheme 510 mg x 1 dose on 04/04/20 She is currently in her menses Repeat CBC in the AM  Steroid-induced hyperglycemia Received 3 doses of IV Decadron 8 mg on 04/02/2020 Serum glucose 152 on 04/03/2020 Monitor for now  Resolved post repletion: Hypokalemia Serum potassium 3.4>> 3.9 on 04/03/2020 Repleted intravenously. Repeat BMP in the morning.  History  of sickle cell trait No acute issues    Code Status: Full code.  Family Communication: Updated her mother via phone on 04/04/20. Disposition Plan: Likely will DC to home once sepsis is resolved.  Consultants:  Curb sided with ENT Dr. Fredric Dine on 04/02/2020.  Procedures:  None.  Antimicrobials:  IV clindamycin.  DVT prophylaxis: SCDs.  Mobilize.  Status is: Inpatient    Dispo:  Patient From: Home  Planned Disposition: Home  Expected discharge date: 04/05/20  Medically stable for discharge: No, ongoing management of sepsis 2/2 to large right tonsillar abscess.         Objective: Vitals:   04/03/20 0908 04/03/20 1353 04/03/20 2103 04/04/20 0520  BP: 110/69 (!) 104/59 112/73 109/64  Pulse: 65 79 74 78  Resp: 16 20 16 16   Temp: 97.9 F (36.6 C) 98.4 F (36.9 C) 98.5 F (36.9 C) 98 F (36.7 C)  TempSrc: Oral Oral Oral Oral  SpO2: (!) 86% 100% 100% 100%    Intake/Output Summary (Last 24 hours) at 04/04/2020 1200 Last data filed at 04/04/2020 1049 Gross per 24 hour  Intake 1767.88 ml  Output 0 ml  Net 1767.88 ml   There were no vitals filed for this visit.  Exam:  . General: 19 y.o. year-old female Pleasant, well developed in no acute distress.  Alert and oriented x3.   . Cardiovascular: Regular rate and rhythm no rubs or gallops. Marland Kitchen Respiratory: Clear to auscultation no wheezes or rales.  Right submandibular swelling much improved. . Abdomen: Soft, normal bowel sounds present.  . Musculoskeletal: No lower extremity edema bilaterally. Marland Kitchen Psychiatry: Mood is appropriate for condition and setting   Data Reviewed: CBC: Recent Labs  Lab 03/29/20 1148 03/29/20 1148 04/01/20 1355 04/02/20 0245 04/02/20 0630 04/03/20 0632 04/04/20 0551  WBC 19.9*  --  26.3* 28.5*  --  19.7* 19.2*  NEUTROABS 15.1*  --  20.4*  --   --  16.4* 12.4*  HGB 10.9*   < > 10.4* 8.2* 9.6* 9.1* 8.4*  HCT 29.8*   < > 28.6* 22.8* 26.4* 25.3* 23.2*  MCV 76.2*  --  77.1*  77.0*  --  76.0* 76.6*  PLT 159  --  204 153  --  215 196   < > = values in this interval not displayed.   Basic Metabolic Panel: Recent Labs  Lab 03/29/20 1148 04/01/20 1355 04/02/20 0245 04/03/20 0632  NA 137 136 135 135  K 3.1* 3.5 3.4* 3.9  CL 99 102 103 105  CO2 24 23 24 23   GLUCOSE 100* 103* 104* 152*  BUN 10 9 <5* 8  CREATININE 0.40* 0.59 0.46 0.48  CALCIUM 9.4 9.0 8.2* 8.7*  MG  --   --   --  2.0  PHOS  --   --   --  3.6   GFR: Estimated Creatinine Clearance: 101.8 mL/min (by C-G formula based on SCr of 0.48 mg/dL). Liver Function Tests: Recent Labs  Lab 03/29/20 1148 04/01/20 1355  AST 16 16  ALT 18 24  ALKPHOS 69 57  BILITOT 3.8* 2.7*  PROT 8.8* 8.9*  ALBUMIN 4.6  4.0   Recent Labs  Lab 03/29/20 1148  LIPASE 27   No results for input(s): AMMONIA in the last 168 hours. Coagulation Profile: Recent Labs  Lab 04/01/20 1621  INR 1.3*   Cardiac Enzymes: No results for input(s): CKTOTAL, CKMB, CKMBINDEX, TROPONINI in the last 168 hours. BNP (last 3 results) No results for input(s): PROBNP in the last 8760 hours. HbA1C: No results for input(s): HGBA1C in the last 72 hours. CBG: No results for input(s): GLUCAP in the last 168 hours. Lipid Profile: No results for input(s): CHOL, HDL, LDLCALC, TRIG, CHOLHDL, LDLDIRECT in the last 72 hours. Thyroid Function Tests: No results for input(s): TSH, T4TOTAL, FREET4, T3FREE, THYROIDAB in the last 72 hours. Anemia Panel: Recent Labs    04/01/20 1355  RETICCTPCT 4.9*   Urine analysis:    Component Value Date/Time   COLORURINE YELLOW 04/01/2020 1644   APPEARANCEUR HAZY (A) 04/01/2020 1644   LABSPEC 1.024 04/01/2020 1644   PHURINE 5.0 04/01/2020 1644   GLUCOSEU NEGATIVE 04/01/2020 1644   HGBUR LARGE (A) 04/01/2020 1644   BILIRUBINUR NEGATIVE 04/01/2020 1644   KETONESUR 80 (A) 04/01/2020 1644   PROTEINUR 100 (A) 04/01/2020 1644   UROBILINOGEN 1.0 10/12/2008 0145   NITRITE NEGATIVE 04/01/2020 1644    LEUKOCYTESUR NEGATIVE 04/01/2020 1644   Sepsis Labs: @LABRCNTIP (procalcitonin:4,lacticidven:4)  ) Recent Results (from the past 240 hour(s))  Group A Strep by PCR     Status: None   Collection Time: 03/26/20  4:48 PM   Specimen: Throat; Sterile Swab  Result Value Ref Range Status   Group A Strep by PCR NOT DETECTED NOT DETECTED Final    Comment: Performed at [facility], [place] [address], [place], [place] [address]  Respiratory Panel by RT PCR (Flu A&B, Covid) - Nasopharyngeal Swab     Status: None   Collection Time: 03/29/20 10:40 AM   Specimen: Nasopharyngeal Swab  Result Value Ref Range Status   SARS Coronavirus 2 by RT PCR NEGATIVE NEGATIVE Final    Comment: (NOTE) SARS-CoV-2 target nucleic acids are NOT DETECTED.  The SARS-CoV-2 RNA is generally detectable in upper respiratoy specimens during the acute phase of infection. The lowest concentration of SARS-CoV-2 viral copies this assay can detect is 131 copies/mL. A negative result does not preclude SARS-Cov-2 infection and should not be used as the sole basis for treatment or other patient management decisions. A negative result may occur with  improper specimen collection/handling, submission of specimen other than nasopharyngeal swab, presence of viral mutation(s) within the areas targeted by this assay, and inadequate number of viral copies (<131 copies/mL). A negative result must be combined with clinical observations, patient history, and epidemiological information. The expected result is Negative.  Fact Sheet for Patients:  PinkCheek.be  Fact Sheet for Healthcare Providers:  GravelBags.it  This test is no t yet approved or cleared by the [place] FDA and  has been authorized for detection and/or diagnosis of SARS-CoV-2 by FDA under an Emergency Use Authorization (EUA). This EUA will remain  in effect (meaning this test can be used) for the  duration of the COVID-19 declaration under Section 564(b)(1) of the Act, 21 U.S.C. section 360bbb-3(b)(1), unless the authorization is terminated or revoked sooner.     Influenza A by PCR NEGATIVE NEGATIVE Final   Influenza B by PCR NEGATIVE NEGATIVE Final    Comment: (NOTE) The Xpert Xpress SARS-CoV-2/FLU/RSV assay is intended as an aid in  the diagnosis of influenza from Nasopharyngeal swab specimens and  should not be used as a sole basis for treatment. Nasal washings and  aspirates are unacceptable for Xpert Xpress SARS-CoV-2/FLU/RSV  testing.  Fact Sheet for Patients: PinkCheek.be  Fact Sheet for Healthcare Providers: GravelBags.it  This test is not yet approved or cleared by the [place] FDA and  has been authorized for detection and/or diagnosis of SARS-CoV-2 by  FDA under an Emergency Use Authorization (EUA). This EUA will remain  in effect (meaning this test can be used) for the duration of the  Covid-19 declaration under Section 564(b)(1) of the Act, 21  U.S.C. section 360bbb-3(b)(1), unless the authorization is  terminated or revoked. Performed at [facility], [place] [address] [place], [place] [address]   Urine Culture     Status: None   Collection Time: 03/29/20  1:08 PM   Specimen: Urine, Random  Result Value Ref Range Status   Specimen Description   Final    URINE, RANDOM Performed at [place] [address], [place], [place] [address]    Special Requests   Final    NONE Performed at [facility], [place] [address] [place], [place] [address]    Culture   Final    NO GROWTH Performed at [facility], [place] [address], [place], [place] [address]    Report Status 03/31/2020 FINAL  Final  Blood Culture (routine x 2)     Status: None (Preliminary result)   Collection Time: 04/01/20  4:21 PM   Specimen: BLOOD  Result Value Ref Range  Status   Specimen Description   Final    BLOOD RIGHT ANTECUBITAL Performed at [place] [address], [place], [place] [address]    Special Requests   Final    BOTTLES DRAWN AEROBIC AND ANAEROBIC Blood Culture adequate volume Performed at [place] [address] [place], [place] [address]    Culture   Final    NO GROWTH 3 DAYS Performed at [facility], [place] [address] [place], [place] [address]    Report Status PENDING  Incomplete  Blood Culture (routine x 2)     Status: None (Preliminary result)   Collection Time: 04/01/20  4:26 PM   Specimen: BLOOD  Result Value Ref Range Status   Specimen Description   Final    BLOOD RIGHT WRIST Performed at [place] [address], [place], [place] [address]    Special Requests   Final    BOTTLES DRAWN AEROBIC AND ANAEROBIC Blood Culture results may not be optimal due to an excessive volume of blood received in culture bottles Performed at [place] [address], [place], [place] [address]    Culture   Final    NO GROWTH 3 DAYS Performed at [facility], [place] [address], [place], [place] [address]    Report Status PENDING  Incomplete  Urine culture     Status: None   Collection Time: 04/01/20  4:44 PM   Specimen: In/Out Cath Urine  Result Value Ref Range Status   Specimen Description   Final    IN/OUT CATH URINE Performed at [place] [address] [place] [address], [place], [place] [address]    Special Requests   Final    NONE Performed at [facility], [place] [address] [place], [place] [address]    Culture   Final    NO GROWTH Performed at [facility], [address], [place], [place] [address]    Report Status 04/02/2020 FINAL  Final  Respiratory Panel by RT PCR (Flu A&B, Covid) - Nasopharyngeal Swab     Status: None   Collection Time: 04/01/20  6:55 PM   Specimen: Nasopharyngeal Swab   Result Value Ref Range Status   SARS Coronavirus 2 by RT PCR NEGATIVE NEGATIVE Final    Comment: (NOTE) SARS-CoV-2 target nucleic acids are NOT DETECTED.  The SARS-CoV-2 RNA is generally detectable in upper respiratoy specimens during the acute phase of infection. The lowest concentration of SARS-CoV-2 viral copies this assay can detect is 131 copies/mL. A negative result does not preclude SARS-Cov-2 infection and should not be used as the sole basis for treatment or other patient management decisions. A negative result may occur with  improper specimen collection/handling, submission of specimen other than nasopharyngeal swab, presence of viral mutation(s) within the areas targeted by this assay, and inadequate number of viral copies (<131 copies/mL). A negative result must be combined with clinical observations, patient history, and epidemiological information. The expected result is Negative.  Fact Sheet for Patients:  PinkCheek.be  Fact Sheet for Healthcare Providers:  GravelBags.it  This test is no t yet approved or cleared by the [place] FDA and  has been authorized for detection and/or diagnosis of SARS-CoV-2 by FDA under an Emergency Use Authorization (EUA). This EUA will remain  in effect (meaning this test can be used) for the duration of the COVID-19 declaration under Section 564(b)(1) of the Act, 21 U.S.C. section 360bbb-3(b)(1), unless the authorization is terminated or revoked sooner.     Influenza A by PCR NEGATIVE NEGATIVE Final   Influenza B by PCR NEGATIVE NEGATIVE Final    Comment: (NOTE) The Xpert Xpress SARS-CoV-2/FLU/RSV assay is intended as an aid in  the diagnosis of influenza from Nasopharyngeal swab specimens and  should not be used as a sole basis for treatment. Nasal washings and  aspirates are unacceptable for Xpert Xpress SARS-CoV-2/FLU/RSV  testing.  Fact Sheet for  Patients: PinkCheek.be  Fact Sheet for Healthcare Providers: GravelBags.it  This test is not yet approved or cleared by the [place] FDA and  has been authorized for detection and/or diagnosis of SARS-CoV-2 by  FDA under an Emergency Use Authorization (EUA). This EUA will remain  in effect (meaning this test can be used) for the duration of the  Covid-19 declaration under Section 564(b)(1) of the Act, 21  U.S.C. section 360bbb-3(b)(1), unless the authorization is  terminated or revoked. Performed at [facility], [place] [address], [place], [place] [address]       Studies: No results found.  Scheduled Meds: . feeding supplement  1 Container Oral TID BM  . [START ON 04/05/2020] ferrous sulfate  325 mg Oral Q breakfast  . pneumococcal 23 valent vaccine  0.5 mL Intramuscular Tomorrow-1000    Continuous Infusions: . clindamycin (CLEOCIN) IV 600 mg (04/04/20 0539)  . ferumoxytol    . lactated ringers 50 mL/hr at 04/04/20 0818     LOS: 3 days     Kayleen Memos, MD [facility] Pager [phone]  If 7PM-7AM, please contact night-coverage [email or web address] Password [email or web address] 04/04/2020, 12:00 PM

## 2020-04-05 LAB — CBC WITH DIFFERENTIAL/PLATELET
Abs Immature Granulocytes: 0.54 10*3/uL — ABNORMAL HIGH (ref 0.00–0.07)
Basophils Absolute: 0.1 10*3/uL (ref 0.0–0.1)
Basophils Relative: 0 %
Eosinophils Absolute: 0.3 10*3/uL (ref 0.0–0.5)
Eosinophils Relative: 2 %
HCT: 23.4 % — ABNORMAL LOW (ref 36.0–46.0)
Hemoglobin: 8.5 g/dL — ABNORMAL LOW (ref 12.0–15.0)
Immature Granulocytes: 4 %
Lymphocytes Relative: 30 %
Lymphs Abs: 4.6 10*3/uL — ABNORMAL HIGH (ref 0.7–4.0)
MCH: 27.7 pg (ref 26.0–34.0)
MCHC: 36.3 g/dL — ABNORMAL HIGH (ref 30.0–36.0)
MCV: 76.2 fL — ABNORMAL LOW (ref 80.0–100.0)
Monocytes Absolute: 1 10*3/uL (ref 0.1–1.0)
Monocytes Relative: 7 %
Neutro Abs: 8.9 10*3/uL — ABNORMAL HIGH (ref 1.7–7.7)
Neutrophils Relative %: 57 %
Platelets: 189 10*3/uL (ref 150–400)
RBC: 3.07 MIL/uL — ABNORMAL LOW (ref 3.87–5.11)
RDW: 16.3 % — ABNORMAL HIGH (ref 11.5–15.5)
WBC: 15.4 10*3/uL — ABNORMAL HIGH (ref 4.0–10.5)
nRBC: 0.7 % — ABNORMAL HIGH (ref 0.0–0.2)

## 2020-04-05 LAB — LACTIC ACID, PLASMA: Lactic Acid, Venous: 0.6 mmol/L (ref 0.5–1.9)

## 2020-04-05 LAB — PROCALCITONIN: Procalcitonin: <0.1 ng/mL

## 2020-04-05 NOTE — Progress Notes (Signed)
PROGRESS NOTE  Ashley Hawkins NFA:[id number] DOB: Oct 03, 2000 DOA: 04/01/2020 PCP: Ladell Pier, MD  HPI/Recap of past 24 hours: Ashley Hawkins is a 19 y.o. female with medical history of sickle cell trait.  She presents with complaint of fever and sore throat x 1 week.  She was seen in an urgent care and had a negative strep test, was placed on amoxicillin.  A few days later she continued to have sore throat and difficulty eating and had developed some nausea and vomiting was seen in the ED at [facility].  At that time strep test was again performed and was negative.  Covid swab was negative.  She was changed from amoxicillin to Keflex.  She continued to have fevers that were greater than 102 degrees at home.  She had nausea, vomiting, and was not able to tolerate p.o. intake.  Associated with worsening pain with swallowing.  Pain was mainly in the right side of her throat.  She also had difficulty opening her mouth secondary to the pain.  No breathing difficulty.    ED Course:   R tonsillar abscess on CT of her neck, WBCs greater than 26,000.  ER provider discussed with ENT who stated that since it was a tonsillar abscess that should be amendable to treatment with antibiotic and not require surgery.  Hospital service asked to admit for further management.  Met SIRS criteria with tachycardia, tachypnea, and leukocytosis.  Received IV fluids and broad-spectrum IV antibiotics in the ED, Zosyn and IV vancomycin.   Discussed with ENT, Dr. Fredric Dine on 04/02/20 who recommended to continue IV clindamycin and to add IV Decadron 8 mg TID x 3 doses.  Her R submandibular edema has significantly improved post IV Decadron.  She has been able to tolerate a regular constency diet ssince 04/03/20.  She remains on IV Clindamycin due to persistent leukocytosis WBC> 19,000 with neutrophilia.  IV Feraheme infused, 510 mg x 1 dose on 04/04/20.  Obtained consent from her mother via phone.  04/05/20:  No new  complaints.  Feels better but wBC >15,000.  Will continue IV abx and repeat labs and CT tomorrow AM.  Assessment/Plan: Principal Problem:   Tonsillar abscess Active Problems:   Fever   Leukocytosis   SIRS (systemic inflammatory response syndrome) (HCC)  Sepsis, improving, secondary to R tonsillar abscess seen on CT scan soft tissue neck. Presented with leukocytosis, WBC 28.5K, tachycardia and tachypnea, with R tonsillar abscess on CT scan. Blood cultures taken on 04/01/2020 - to date. Discussed with ENT on 04/02/2020, Dr. Fredric Dine, recommended management with IV antibiotics, IV clindamycin 600 or 900 mg Q8H.  Added IV Decadron 8 mg every 8 hours x3 doses for swelling on 11/12, completed. Pain control as needed.  Continue IV fluid Reg diet on 04/03/20, advanced from clear liquid diet.  Tolerating regular consistency diet well. WBC 15,000 from 19,000 with neutrophilia, continue IV abx, repeat CBC w diff in the AM.  Also repeat CT scan in the AM. Increase IV fluid LR at 125 cc/hr in anticipation for IV contrast in the AM.  Large right tonsillar abscess up to 2.8 cm in diameter 04/01/20 CT soft tissue neck with contrast done on 04/01/2020 revealed: 1. Positive for a large Right Tonsillar Abscess, up to 2.8 cm diameter. 2. Widespread right parapharyngeal inflammation. Small retropharyngeal effusion. Secondary inflammation in the right submandibular space also. 3. Right greater than left reactive cervical lymphadenopathy. Continue IV clindamycin as stated above Repeat CT scan soft tissue neck  with contrast on 04/06/20  Acute blood loss anemia in the setting of menses Microcytic anemia, chronic Presented with hemoglobin 10.9 with MCV of 76. Hemoglobin 9.6 on 04/02/2020, Hg 9.1 on 04/03/2020, Hg 8.4 on 04/04/20, 8.5 on 04/05/20 Continue ferrous sulfate 325 mg daily Received IV Feraheme 510 mg x 1 dose on 04/04/20  Steroid-induced hyperglycemia Received 3 doses of IV Decadron 8 mg on  04/02/2020 Serum glucose 152 on 04/03/2020 Monitor for now  Resolved post repletion: Hypokalemia Serum potassium 3.4>> 3.9 on 04/03/2020 Repleted intravenously. Repeat BMP in the morning.  History of sickle cell trait No acute issues    Code Status: Full code.  Family Communication: Updated her mother via phone on 04/05/20. Disposition Plan: Likely will DC to home once sepsis is resolved likely 04/06/20.  Consultants:  Curb sided with ENT Dr. Fredric Dine on 04/02/2020.  Procedures:  None.  Antimicrobials:  IV clindamycin.  DVT prophylaxis: SCDs.  Mobilize.  Status is: Inpatient    Dispo:  Patient From: Home  Planned Disposition: Home  Expected discharge date: 04/06/20  Medically stable for discharge: No, ongoing management of sepsis 2/2 to large right tonsillar abscess.         Objective: Vitals:   04/04/20 1403 04/04/20 2014 04/05/20 0520 04/05/20 1300  BP: 116/67 140/76 120/71 131/77  Pulse: 77 71 72 61  Resp: 19 18 17 18   Temp: (!) 97.3 F (36.3 C) 98.2 F (36.8 C) 98.4 F (36.9 C) 97.9 F (36.6 C)  TempSrc: Oral Oral  Oral  SpO2: 100% 100% 100% 100%  Weight:   59 kg   Height:   5' 5"  (1.651 m)     Intake/Output Summary (Last 24 hours) at 04/05/2020 1730 Last data filed at 04/05/2020 1728 Gross per 24 hour  Intake 2960.53 ml  Output 1250 ml  Net 1710.53 ml   Filed Weights   04/05/20 0520  Weight: 59 kg    Exam:  . General: 19 y.o. year-old female Wd WN NAD A&O x 3 . Cardiovascular: RRR no rubs or gallops . Respiratory: CTA no wheezes or rales  Right submandibular swelling much improved. . Abdomen: Soft NT NBS . Musculoskeletal: No LE edema . Psychiatry: Mood is appropriate   Data Reviewed: CBC: Recent Labs  Lab 04/01/20 1355 04/01/20 1355 04/02/20 0245 04/02/20 0630 04/03/20 0632 04/04/20 0551 04/05/20 0548  WBC 26.3*  --  28.5*  --  19.7* 19.2* 15.4*  NEUTROABS 20.4*  --   --   --  16.4* 12.4* 8.9*  HGB 10.4*   <  > 8.2* 9.6* 9.1* 8.4* 8.5*  HCT 28.6*   < > 22.8* 26.4* 25.3* 23.2* 23.4*  MCV 77.1*  --  77.0*  --  76.0* 76.6* 76.2*  PLT 204  --  153  --  215 196 189   < > = values in this interval not displayed.   Basic Metabolic Panel: Recent Labs  Lab 04/01/20 1355 04/02/20 0245 04/03/20 0632  NA 136 135 135  K 3.5 3.4* 3.9  CL 102 103 105  CO2 23 24 23   GLUCOSE 103* 104* 152*  BUN 9 <5* 8  CREATININE 0.59 0.46 0.48  CALCIUM 9.0 8.2* 8.7*  MG  --   --  2.0  PHOS  --   --  3.6   GFR: Estimated Creatinine Clearance: 101.8 mL/min (by C-G formula based on SCr of 0.48 mg/dL). Liver Function Tests: Recent Labs  Lab 04/01/20 1355  AST 16  ALT 24  ALKPHOS  57  BILITOT 2.7*  PROT 8.9*  ALBUMIN 4.0   No results for input(s): LIPASE, AMYLASE in the last 168 hours. No results for input(s): AMMONIA in the last 168 hours. Coagulation Profile: Recent Labs  Lab 04/01/20 1621  INR 1.3*   Cardiac Enzymes: No results for input(s): CKTOTAL, CKMB, CKMBINDEX, TROPONINI in the last 168 hours. BNP (last 3 results) No results for input(s): PROBNP in the last 8760 hours. HbA1C: No results for input(s): HGBA1C in the last 72 hours. CBG: No results for input(s): GLUCAP in the last 168 hours. Lipid Profile: No results for input(s): CHOL, HDL, LDLCALC, TRIG, CHOLHDL, LDLDIRECT in the last 72 hours. Thyroid Function Tests: No results for input(s): TSH, T4TOTAL, FREET4, T3FREE, THYROIDAB in the last 72 hours. Anemia Panel: No results for input(s): VITAMINB12, FOLATE, FERRITIN, TIBC, IRON, RETICCTPCT in the last 72 hours. Urine analysis:    Component Value Date/Time   COLORURINE YELLOW 04/01/2020 1644   APPEARANCEUR HAZY (A) 04/01/2020 1644   LABSPEC 1.024 04/01/2020 1644   PHURINE 5.0 04/01/2020 1644   GLUCOSEU NEGATIVE 04/01/2020 1644   HGBUR LARGE (A) 04/01/2020 1644   BILIRUBINUR NEGATIVE 04/01/2020 1644   KETONESUR 80 (A) 04/01/2020 1644   PROTEINUR 100 (A) 04/01/2020 1644    UROBILINOGEN 1.0 10/12/2008 0145   NITRITE NEGATIVE 04/01/2020 1644   LEUKOCYTESUR NEGATIVE 04/01/2020 1644   Sepsis Labs: @LABRCNTIP (procalcitonin:4,lacticidven:4)  ) Recent Results (from the past 240 hour(s))  Respiratory Panel by RT PCR (Flu A&B, Covid) - Nasopharyngeal Swab     Status: None   Collection Time: 03/29/20 10:40 AM   Specimen: Nasopharyngeal Swab  Result Value Ref Range Status   SARS Coronavirus 2 by RT PCR NEGATIVE NEGATIVE Final    Comment: (NOTE) SARS-CoV-2 target nucleic acids are NOT DETECTED.  The SARS-CoV-2 RNA is generally detectable in upper respiratoy specimens during the acute phase of infection. The lowest concentration of SARS-CoV-2 viral copies this assay can detect is 131 copies/mL. A negative result does not preclude SARS-Cov-2 infection and should not be used as the sole basis for treatment or other patient management decisions. A negative result may occur with  improper specimen collection/handling, submission of specimen other than nasopharyngeal swab, presence of viral mutation(s) within the areas targeted by this assay, and inadequate number of viral copies (<131 copies/mL). A negative result must be combined with clinical observations, patient history, and epidemiological information. The expected result is Negative.  Fact Sheet for Patients:  PinkCheek.be  Fact Sheet for Healthcare Providers:  GravelBags.it  This test is no t yet approved or cleared by the [place] FDA and  has been authorized for detection and/or diagnosis of SARS-CoV-2 by FDA under an Emergency Use Authorization (EUA). This EUA will remain  in effect (meaning this test can be used) for the duration of the COVID-19 declaration under Section 564(b)(1) of the Act, 21 U.S.C. section 360bbb-3(b)(1), unless the authorization is terminated or revoked sooner.     Influenza A by PCR NEGATIVE NEGATIVE Final    Influenza B by PCR NEGATIVE NEGATIVE Final    Comment: (NOTE) The Xpert Xpress SARS-CoV-2/FLU/RSV assay is intended as an aid in  the diagnosis of influenza from Nasopharyngeal swab specimens and  should not be used as a sole basis for treatment. Nasal washings and  aspirates are unacceptable for Xpert Xpress SARS-CoV-2/FLU/RSV  testing.  Fact Sheet for Patients: PinkCheek.be  Fact Sheet for Healthcare Providers: GravelBags.it  This test is not yet approved or cleared by the [place]  FDA and  has been authorized for detection and/or diagnosis of SARS-CoV-2 by  FDA under an Emergency Use Authorization (EUA). This EUA will remain  in effect (meaning this test can be used) for the duration of the  Covid-19 declaration under Section 564(b)(1) of the Act, 21  U.S.C. section 360bbb-3(b)(1), unless the authorization is  terminated or revoked. Performed at [facility], [place] [address], [place], [place] [address]   Urine Culture     Status: None   Collection Time: 03/29/20  1:08 PM   Specimen: Urine, Random  Result Value Ref Range Status   Specimen Description   Final    URINE, RANDOM Performed at [facility] [address], [place], [place] [address]    Special Requests   Final    NONE Performed at [facility], [place] [address], [place], [place] [address]    Culture   Final    NO GROWTH Performed at [facility], [place] [address] [place], [place] [address]    Report Status 03/31/2020 FINAL  Final  Blood Culture (routine x 2)     Status: None (Preliminary result)   Collection Time: 04/01/20  4:21 PM   Specimen: BLOOD  Result Value Ref Range Status   Specimen Description   Final    BLOOD RIGHT ANTECUBITAL Performed at [facility] [address], [place], [place] [address]    Special Requests   Final    BOTTLES DRAWN AEROBIC AND  ANAEROBIC Blood Culture adequate volume Performed at [facility] [address] [place], [place] [address]    Culture   Final    NO GROWTH 4 DAYS Performed at [facility], [place] [address], [place], [place] [address]    Report Status PENDING  Incomplete  Blood Culture (routine x 2)     Status: None (Preliminary result)   Collection Time: 04/01/20  4:26 PM   Specimen: BLOOD  Result Value Ref Range Status   Specimen Description   Final    BLOOD RIGHT WRIST Performed at [facility] [address], [place], [place] [address]    Special Requests   Final    BOTTLES DRAWN AEROBIC AND ANAEROBIC Blood Culture results may not be optimal due to an excessive volume of blood received in culture bottles Performed at [facility] [address], [place], [place] [address]    Culture   Final    NO GROWTH 4 DAYS Performed at [facility], [place] [address], [place], [place] [address]    Report Status PENDING  Incomplete  Urine culture     Status: None   Collection Time: 04/01/20  4:44 PM   Specimen: In/Out Cath Urine  Result Value Ref Range Status   Specimen Description   Final    IN/OUT CATH URINE Performed at [facility] [address], [place], [place] [address]    Special Requests   Final    NONE Performed at [facility], [place] [address], [place], [place] [address]    Culture   Final    NO GROWTH Performed at [facility], [place] [address] [place], [place] [address]    Report Status 04/02/2020 FINAL  Final  Respiratory Panel by RT PCR (Flu A&B, Covid) - Nasopharyngeal Swab     Status: None   Collection Time: 04/01/20  6:55 PM   Specimen: Nasopharyngeal Swab  Result Value Ref Range Status   SARS Coronavirus 2 by RT PCR NEGATIVE NEGATIVE Final    Comment: (NOTE) SARS-CoV-2 target nucleic acids are NOT DETECTED.  The SARS-CoV-2 RNA is generally detectable in upper  respiratoy specimens during the acute phase of infection. The lowest concentration of SARS-CoV-2 viral copies this assay can detect is 131 copies/mL. A negative result does not preclude SARS-Cov-2 infection and should not be used as the sole basis for treatment or other patient management decisions. A negative result may occur with  improper specimen collection/handling, submission of specimen other than nasopharyngeal swab, presence of viral mutation(s) within the areas targeted by this assay, and inadequate number of viral copies (<131 copies/mL). A negative result must be combined with clinical observations, patient history, and epidemiological information. The expected result is Negative.  Fact Sheet for Patients:  PinkCheek.be  Fact Sheet for Healthcare Providers:  GravelBags.it  This test is no t yet approved or cleared by the [place] FDA and  has been authorized for detection and/or diagnosis of SARS-CoV-2 by FDA under an Emergency Use Authorization (EUA). This EUA will remain  in effect (meaning this test can be used) for the duration of the COVID-19 declaration under Section 564(b)(1) of the Act, 21 U.S.C. section 360bbb-3(b)(1), unless the authorization is terminated or revoked sooner.     Influenza A by PCR NEGATIVE NEGATIVE Final   Influenza B by PCR NEGATIVE NEGATIVE Final    Comment: (NOTE) The Xpert Xpress SARS-CoV-2/FLU/RSV assay is intended as an aid in  the diagnosis of influenza from Nasopharyngeal swab specimens and  should not be used as a sole basis for treatment. Nasal washings and  aspirates are unacceptable for Xpert Xpress SARS-CoV-2/FLU/RSV  testing.  Fact Sheet for Patients: PinkCheek.be  Fact Sheet for Healthcare Providers: GravelBags.it  This test is not yet approved or cleared by the [place] FDA and  has been  authorized for detection and/or diagnosis of SARS-CoV-2 by  FDA under an Emergency Use Authorization (EUA). This EUA will remain  in effect (meaning this test can be used) for the duration of the  Covid-19 declaration under Section 564(b)(1) of the Act, 21  U.S.C. section 360bbb-3(b)(1), unless the authorization is  terminated or revoked. Performed at [facility], [place] [address], [place], [place] [address]       Studies: No results found.  Scheduled Meds: . feeding supplement  1 Container Oral TID BM  . ferrous sulfate  325 mg Oral Q breakfast  . pneumococcal 23 valent vaccine  0.5 mL Intramuscular Tomorrow-1000    Continuous Infusions: . clindamycin (CLEOCIN) IV 600 mg (04/05/20 1328)  . lactated ringers 50 mL/hr at 04/04/20 1500     LOS: 4 days     Kayleen Memos, MD [facility] Pager [phone]  If 7PM-7AM, please contact night-coverage [email or web address] Password [email or web address] 04/05/2020, 5:30 PM

## 2020-04-06 ENCOUNTER — Inpatient Hospital Stay (HOSPITAL_COMMUNITY): Payer: 59

## 2020-04-06 ENCOUNTER — Encounter (HOSPITAL_COMMUNITY): Payer: Self-pay | Admitting: Family Medicine

## 2020-04-06 LAB — CBC WITH DIFFERENTIAL/PLATELET
Abs Immature Granulocytes: 0.55 10*3/uL — ABNORMAL HIGH (ref 0.00–0.07)
Basophils Absolute: 0.1 10*3/uL (ref 0.0–0.1)
Basophils Relative: 1 %
Eosinophils Absolute: 0.3 10*3/uL (ref 0.0–0.5)
Eosinophils Relative: 2 %
HCT: 24.6 % — ABNORMAL LOW (ref 36.0–46.0)
Hemoglobin: 8.8 g/dL — ABNORMAL LOW (ref 12.0–15.0)
Immature Granulocytes: 4 %
Lymphocytes Relative: 23 %
Lymphs Abs: 3.6 10*3/uL (ref 0.7–4.0)
MCH: 27.4 pg (ref 26.0–34.0)
MCHC: 35.8 g/dL (ref 30.0–36.0)
MCV: 76.6 fL — ABNORMAL LOW (ref 80.0–100.0)
Monocytes Absolute: 1.1 10*3/uL — ABNORMAL HIGH (ref 0.1–1.0)
Monocytes Relative: 7 %
Neutro Abs: 10.4 10*3/uL — ABNORMAL HIGH (ref 1.7–7.7)
Neutrophils Relative %: 63 %
Platelets: 230 10*3/uL (ref 150–400)
RBC: 3.21 MIL/uL — ABNORMAL LOW (ref 3.87–5.11)
RDW: 16.6 % — ABNORMAL HIGH (ref 11.5–15.5)
WBC: 15.9 10*3/uL — ABNORMAL HIGH (ref 4.0–10.5)
nRBC: 0.9 % — ABNORMAL HIGH (ref 0.0–0.2)

## 2020-04-06 LAB — CULTURE, BLOOD (ROUTINE X 2)
Culture: NO GROWTH
Culture: NO GROWTH
Special Requests: ADEQUATE

## 2020-04-06 MED ORDER — SACCHAROMYCES BOULARDII 250 MG PO CAPS
250.0000 mg | ORAL_CAPSULE | Freq: Two times a day (BID) | ORAL | Status: DC
  Administered 2020-04-06 – 2020-04-07 (×3): 250 mg via ORAL
  Filled 2020-04-06 (×3): qty 1

## 2020-04-06 MED ORDER — FERROUS SULFATE 325 (65 FE) MG PO TABS: 325.0000 mg | ORAL_TABLET | Freq: Every day | ORAL | 0 refills | Status: DC

## 2020-04-06 MED ORDER — SACCHAROMYCES BOULARDII 250 MG PO CAPS: 250.0000 mg | ORAL_CAPSULE | Freq: Two times a day (BID) | ORAL | 0 refills | Status: AC

## 2020-04-06 MED ORDER — IOHEXOL 300 MG/ML  SOLN
75.0000 mL | Freq: Once | INTRAMUSCULAR | Status: AC | PRN
  Administered 2020-04-06: 75 mL via INTRAVENOUS

## 2020-04-06 MED ORDER — LACTATED RINGERS IV SOLN: INTRAVENOUS | Status: DC

## 2020-04-06 MED ORDER — CLINDAMYCIN HCL 150 MG PO CAPS: 150.0000 mg | ORAL_CAPSULE | Freq: Four times a day (QID) | ORAL | 0 refills | Status: DC

## 2020-04-06 NOTE — Discharge Instructions (Signed)
Tonsillitis ° °Tonsillitis is an infection of the throat. This infection causes the tonsils to become red, tender, and swollen. Tonsils are tissues in the back of your throat. If bacteria caused your infection, antibiotic medicine will be given to you. Sometimes, symptoms of this infection can be treated with the use of medicines that lessen swelling (steroids). If your tonsillitis is very bad (severe) and happens often, you may need to get your tonsils removed (tonsillectomy). °Follow these instructions at home: °Medicines °· Take over-the-counter and prescription medicines only as told by your doctor. °· If you were prescribed an antibiotic, take it as told by your doctor. Do not stop taking the antibiotic even if you start to feel better. °Eating and drinking °· Drink enough fluid to keep your pee (urine) clear or pale yellow. °· While your throat is sore, eat soft or liquid foods like: °? Soup. °? [place]bert. °? Instant breakfast drinks. °· Drink warm fluids. °· Eat frozen ice pops. °General instructions °· Rest as much as possible and get plenty of sleep. °· Gargle with a salt-water mixture 3-4 times a day or as needed. To make a salt-water mixture, completely dissolve ½-1 tsp of salt in 1 cup of warm water. °· Wash your hands often with soap and water. If there is no soap and water, use hand sanitizer. °· Do not share cups, bottles, or other utensils until your symptoms are gone. °· Do not smoke. If you need help quitting, ask your doctor. °· Keep all follow-up visits as told by your doctor. This is important. °Contact a doctor if: °· You have large, tender lumps in your neck. °· You have a fever that does not go away after 2-3 days. °· You have a rash. °· You cough up green, yellow-brown, or bloody fluid. °· You cannot swallow liquids or food for 24 hours. °· Only one of your tonsils is swollen. °Get help right away if: °· You have any new symptoms such as: °? Vomiting. °? Very bad headache. °? Stiff  neck. °? Chest pain. °? Trouble breathing or swallowing. °· You have very bad throat pain and you also have drooling or voice changes. °· You have very bad pain that is not helped by medicine. °· You cannot fully open your mouth. °· You have redness, swelling, or severe pain anywhere in your neck. °Summary °· Tonsillitis causes your tonsils to be red, tender, and swollen. °· While your throat is sore, eat soft or liquid foods. °· Gargle with a salt-water mixture 3-4 times a day or as needed. °· Do not share cups, bottles, or other utensils until your symptoms are gone. °This information is not intended to replace advice given to you by your health care provider. Make sure you discuss any questions you have with your health care provider. °Document Revised: 04/20/2017 Document Reviewed: 06/13/2016 °Elsevier Patient Education © 2020 [employer] ° °

## 2020-04-06 NOTE — Progress Notes (Signed)
PROGRESS NOTE  Ashley Hawkins AOZ:[id number] DOB: [birth year] DOA: 04/01/2020 PCP: Ladell Pier, MD  HPI/Recap of past 24 hours: Ashley Hawkins is a 19 y.o. female with medical history of sickle cell trait.  She presents with complaint of fever and sore throat x 1 week.  She was seen in an urgent care and had a negative strep test, was placed on amoxicillin.  A few days later she continued to have sore throat and difficulty eating and had developed some nausea and vomiting was seen in the ED at [facility].  At that time strep test was again performed and was negative.  Covid swab was negative.  She was changed from amoxicillin to Keflex.  She continued to have fevers that were greater than 102 degrees at home.  She had nausea, vomiting, and was not able to tolerate p.o. intake.  Associated with worsening pain with swallowing.  Pain was mainly in the right side of her throat.  She also had difficulty opening her mouth secondary to the pain.  No breathing difficulty.    ED Course:   R tonsillar abscess on CT of her neck, WBCs greater than 26,000.  ER provider discussed with ENT, who recommended medical management, no surgery.   Hospital service asked to admit for further management.  Met SIRS criteria with tachycardia, tachypnea, and leukocytosis.  Received IV fluids and broad-spectrum IV antibiotics in the ED, Zosyn and IV vancomycin which were switched to IV clindamycin.   Discussed with ENT, Dr. Fredric Dine on 04/02/20 who recommended to continue IV clindamycin and to add IV Decadron 8 mg TID x 3 doses due to significant R submandibular edema.  Her R submandibular edema significantly improved post IV Decadron.  She has been able to tolerate a regular constency diet since 04/03/20.  She remains on IV Clindamycin due to persistent leukocytosis WBC> 15,900 from 19,000 with neutrophilia.  Received infusion of IV Feraheme 510 mg x 1 dose on 04/04/2020 due to microcytic anemia with ongoing menses.  Consent  was obtained from her mother via phone.   Ordered a repeat CT scan of soft tissue neck with contrast 04/06/2020, which is pending.  Increased IV fluid rate to LR at 125 cc/h in anticipation for IV contrast to avoid contrast-induced nephropathy.  04/06/20:  Sepsis criteria is still present with slight increase in her white count, neutrophil count with some tachycardia overnight.  Discussed with ENT Dr. Hayden Rasmussen who will see the patient in consultation.     Assessment/Plan: Principal Problem:   Tonsillar abscess Active Problems:   Fever   Leukocytosis   SIRS (systemic inflammatory response syndrome) (HCC)  Sepsis, improving, secondary to R tonsillar abscess seen on CT scan soft tissue neck. Presented with leukocytosis, WBC 28.5K, tachycardia and tachypnea, with R tonsillar abscess on CT scan. Blood cultures taken on 04/01/2020 - to date. Discussed with ENT on 04/02/2020, Dr. Fredric Dine, recommended management with IV antibiotics, IV clindamycin 600 or 900 mg Q8H.  Added IV Decadron 8 mg every 8 hours x3 doses for swelling on 11/12, completed. Pain control as needed.  Continue IV fluid Reg diet on 04/03/20, advanced from clear liquid diet.  Tolerating regular consistency diet well. WBC 15,900 from 15,000 from 19,000 with neutrophilia, continue IV abx, repeat CBC w diff in the AM.  Also repeat CT scan in the AM. Increase IV fluid LR at 125 cc/hr in anticipation for IV contrast. She is on day#4 of IV Clindamycin ENT consult 04/06/20  Large right tonsillar  abscess up to 2.8 cm in diameter 04/01/20 CT soft tissue neck with contrast done on 04/01/2020 revealed: 1. Positive for a large Right Tonsillar Abscess, up to 2.8 cm diameter. 2. Widespread right parapharyngeal inflammation. Small retropharyngeal effusion. Secondary inflammation in the right submandibular space also. 3. Right greater than left reactive cervical lymphadenopathy. Continue IV clindamycin as stated above Repeat CT scan  soft tissue neck with contrast on 04/06/20  Acute blood loss anemia in the setting of menses Microcytic anemia, chronic Presented with hemoglobin 10.9 with MCV of 76. Hemoglobin 9.6 on 04/02/2020, Hg 9.1 on 04/03/2020, Hg 8.4 on 04/04/20, 8.5 on 04/05/20, 8.8 on 04/06/2020. Continue ferrous sulfate 325 mg daily Received IV Feraheme 510 mg x 1 dose on 04/04/20  Steroid-induced hyperglycemia Received 3 doses of IV Decadron 8 mg on 04/02/2020 Serum glucose 152 on 04/03/2020 Monitor for now  Resolved post repletion: Hypokalemia Serum potassium 3.4>> 3.9 on 04/03/2020 Repleted intravenously. Repeat BMP in the morning.  History of sickle cell trait No acute issues    Code Status: Full code.  Family Communication: Updated her mother via phone on 04/05/20. Disposition Plan: Likely will DC to home once sepsis is resolved likely 04/06/20.  Consultants:  Curb sided with ENT Dr. Fredric Dine on 04/02/2020.  Officially consulted ENT on 04/06/2020.  Procedures:  None.  Antimicrobials:  IV clindamycin.  DVT prophylaxis: SCDs.  Mobilize.  Status is: Inpatient    Dispo:  Patient From: Home  Planned Disposition: Home  Expected discharge date: 04/06/20  Medically stable for discharge: No, ongoing management of sepsis 2/2 to large right tonsillar abscess.         Objective: Vitals:   04/05/20 1300 04/05/20 2144 04/05/20 2145 04/06/20 0601  BP: 131/77 (!) 97/57 121/74 (!) 112/59  Pulse: 61 81 (!) 106 76  Resp: _0 Temp: 97.9 F (36.6 C) 98.8 F (37.1 C)  98.8 F (37.1 C)  TempSrc: Oral Oral  Oral  SpO2: 100% 100% 100% 100%  Weight:      Height:        Intake/Output Summary (Last 24 hours) at 04/06/2020 0865 Last data filed at 04/06/2020 0600 Gross per 24 hour  Intake 3151.9 ml  Output 700 ml  Net 2451.9 ml   Filed Weights   04/05/20 0520  Weight: 59 kg    Exam:  . General: 19 y.o. year-old female pleasant in no acute distress.  Alert and  oriented x3. . Cardiovascular: Regular rate and rhythm no rubs or gallops.  Marland Kitchen Respiratory: Clear to auscultation no wheezes or rales.   . Abdomen: Soft normal bowel sounds present.   . Musculoskeletal: No lower extremity edema bilaterally. Marland Kitchen Psychiatry: Mood is appropriate for condition and setting.   Data Reviewed: CBC: Recent Labs  Lab 04/01/20 1355 04/01/20 1355 04/02/20 0245 04/02/20 0245 04/02/20 0630 04/03/20 7846 04/04/20 0551 04/05/20 0548 04/06/20 0519  WBC 26.3*   < > 28.5*  --   --  19.7* 19.2* 15.4* 15.9*  NEUTROABS 20.4*  --   --   --   --  16.4* 12.4* 8.9* 10.4*  HGB 10.4*   < > 8.2*   < > 9.6* 9.1* 8.4* 8.5* 8.8*  HCT 28.6*   < > 22.8*   < > 26.4* 25.3* 23.2* 23.4* 24.6*  MCV 77.1*   < > 77.0*  --   --  76.0* 76.6* 76.2* 76.6*  PLT 204   < > 153  --   --  215 196  189 230   < > = values in this interval not displayed.   Basic Metabolic Panel: Recent Labs  Lab 04/01/20 1355 04/02/20 0245 04/03/20 0632  NA 136 135 135  K 3.5 3.4* 3.9  CL 102 103 105  CO2 _0 GLUCOSE 103* 104* 152*  BUN 9 <5* 8  CREATININE 0.59 0.46 0.48  CALCIUM 9.0 8.2* 8.7*  MG  --   --  2.0  PHOS  --   --  3.6   GFR: Estimated Creatinine Clearance: 101.8 mL/min (by C-G formula based on SCr of 0.48 mg/dL). Liver Function Tests: Recent Labs  Lab 04/01/20 1355  AST 16  ALT 24  ALKPHOS 57  BILITOT 2.7*  PROT 8.9*  ALBUMIN 4.0   No results for input(s): LIPASE, AMYLASE in the last 168 hours. No results for input(s): AMMONIA in the last 168 hours. Coagulation Profile: Recent Labs  Lab 04/01/20 1621  INR 1.3*   Cardiac Enzymes: No results for input(s): CKTOTAL, CKMB, CKMBINDEX, TROPONINI in the last 168 hours. BNP (last 3 results) No results for input(s): PROBNP in the last 8760 hours. HbA1C: No results for input(s): HGBA1C in the last 72 hours. CBG: No results for input(s): GLUCAP in the last 168 hours. Lipid Profile: No results for input(s): CHOL, HDL,  LDLCALC, TRIG, CHOLHDL, LDLDIRECT in the last 72 hours. Thyroid Function Tests: No results for input(s): TSH, T4TOTAL, FREET4, T3FREE, THYROIDAB in the last 72 hours. Anemia Panel: No results for input(s): VITAMINB12, FOLATE, FERRITIN, TIBC, IRON, RETICCTPCT in the last 72 hours. Urine analysis:    Component Value Date/Time   COLORURINE YELLOW 04/01/2020 1644   APPEARANCEUR HAZY (A) 04/01/2020 1644   LABSPEC 1.024 04/01/2020 1644   PHURINE 5.0 04/01/2020 1644   GLUCOSEU NEGATIVE 04/01/2020 1644   HGBUR LARGE (A) 04/01/2020 1644   BILIRUBINUR NEGATIVE 04/01/2020 1644   KETONESUR 80 (A) 04/01/2020 1644   PROTEINUR 100 (A) 04/01/2020 1644   UROBILINOGEN 1.0 10/12/2008 0145   NITRITE NEGATIVE 04/01/2020 1644   LEUKOCYTESUR NEGATIVE 04/01/2020 1644   Sepsis Labs: _1 (procalcitonin:4,lacticidven:4)  ) Recent Results (from the past 240 hour(s))  Respiratory Panel by RT PCR (Flu A&B, Covid) - Nasopharyngeal Swab     Status: None   Collection Time: 03/29/20 10:40 AM   Specimen: Nasopharyngeal Swab  Result Value Ref Range Status   SARS Coronavirus 2 by RT PCR NEGATIVE NEGATIVE Final    Comment: (NOTE) SARS-CoV-2 target nucleic acids are NOT DETECTED.  The SARS-CoV-2 RNA is generally detectable in upper respiratoy specimens during the acute phase of infection. The lowest concentration of SARS-CoV-2 viral copies this assay can detect is 131 copies/mL. A negative result does not preclude SARS-Cov-2 infection and should not be used as the sole basis for treatment or other patient management decisions. A negative result may occur with  improper specimen collection/handling, submission of specimen other than nasopharyngeal swab, presence of viral mutation(s) within the areas targeted by this assay, and inadequate number of viral copies (<131 copies/mL). A negative result must be combined with clinical observations, patient history, and epidemiological information. The expected  result is Negative.  Fact Sheet for Patients:  PinkCheek.be  Fact Sheet for Healthcare Providers:  GravelBags.it  This test is no t yet approved or cleared by the [place] FDA and  has been authorized for detection and/or diagnosis of SARS-CoV-2 by FDA under an Emergency Use Authorization (EUA). This EUA will remain  in effect (meaning this test can be used) for  the duration of the COVID-19 declaration under Section 564(b)(1) of the Act, 21 U.S.C. section 360bbb-3(b)(1), unless the authorization is terminated or revoked sooner.     Influenza A by PCR NEGATIVE NEGATIVE Final   Influenza B by PCR NEGATIVE NEGATIVE Final    Comment: (NOTE) The Xpert Xpress SARS-CoV-2/FLU/RSV assay is intended as an aid in  the diagnosis of influenza from Nasopharyngeal swab specimens and  should not be used as a sole basis for treatment. Nasal washings and  aspirates are unacceptable for Xpert Xpress SARS-CoV-2/FLU/RSV  testing.  Fact Sheet for Patients: PinkCheek.be  Fact Sheet for Healthcare Providers: GravelBags.it  This test is not yet approved or cleared by the [place] FDA and  has been authorized for detection and/or diagnosis of SARS-CoV-2 by  FDA under an Emergency Use Authorization (EUA). This EUA will remain  in effect (meaning this test can be used) for the duration of the  Covid-19 declaration under Section 564(b)(1) of the Act, 21  U.S.C. section 360bbb-3(b)(1), unless the authorization is  terminated or revoked. Performed at [facility], [place] [address], [place], [place] [address]   Urine Culture     Status: None   Collection Time: 03/29/20  1:08 PM   Specimen: Urine, Random  Result Value Ref Range Status   Specimen Description   Final    URINE, RANDOM Performed at [place] [address], [place], [place]  [address]    Special Requests   Final    NONE Performed at [facility], [place] [address], [place], [place] [address]    Culture   Final    NO GROWTH Performed at [facility], [place] [address] [place], [place] [address]    Report Status 03/31/2020 FINAL  Final  Blood Culture (routine x 2)     Status: None (Preliminary result)   Collection Time: 04/01/20  4:21 PM   Specimen: BLOOD  Result Value Ref Range Status   Specimen Description   Final    BLOOD RIGHT ANTECUBITAL Performed at [place] [address] [place], [place] [address]    Special Requests   Final    BOTTLES DRAWN AEROBIC AND ANAEROBIC Blood Culture adequate volume Performed at [place] [address] [place], [place] [address]    Culture   Final    NO GROWTH 4 DAYS Performed at [facility], [place] [address], [place], [place] [address]    Report Status PENDING  Incomplete  Blood Culture (routine x 2)     Status: None (Preliminary result)   Collection Time: 04/01/20  4:26 PM   Specimen: BLOOD  Result Value Ref Range Status   Specimen Description   Final    BLOOD RIGHT WRIST Performed at [place] [address] [place], [place] [address]    Special Requests   Final    BOTTLES DRAWN AEROBIC AND ANAEROBIC Blood Culture results may not be optimal due to an excessive volume of blood received in culture bottles Performed at [place] [address], [place], [place] [address]    Culture   Final    NO GROWTH 4 DAYS Performed at [facility], [place] [address] [place],  [address]    Report Status PENDING  Incomplete  Urine culture     Status: None   Collection Time: 04/01/20  4:44 PM   Specimen: In/Out Cath Urine  Result Value  Ref Range Status   Specimen Description   Final    IN/OUT CATH URINE Performed at [place] [address], [place], [place] [address]     Special Requests   Final    NONE Performed at [facility], [place] [address] [place] [address], [place], [place] [address]    Culture   Final    NO GROWTH Performed at [facility], [place] [address] [place], [place] [address]    Report Status 04/02/2020 FINAL  Final  Respiratory Panel by RT PCR (Flu A&B, Covid) - Nasopharyngeal Swab     Status: None   Collection Time: 04/01/20  6:55 PM   Specimen: Nasopharyngeal Swab  Result Value Ref Range Status   SARS Coronavirus 2 by RT PCR NEGATIVE NEGATIVE Final    Comment: (NOTE) SARS-CoV-2 target nucleic acids are NOT DETECTED.  The SARS-CoV-2 RNA is generally detectable in upper respiratoy specimens during the acute phase of infection. The lowest concentration of SARS-CoV-2 viral copies this assay can detect is 131 copies/mL. A negative result does not preclude SARS-Cov-2 infection and should not be used as the sole basis for treatment or other patient management decisions. A negative result may occur with  improper specimen collection/handling, submission of specimen other than nasopharyngeal swab, presence of viral mutation(s) within the areas targeted by this assay, and inadequate number of viral copies (<131 copies/mL). A negative result must be combined with clinical observations, patient history, and epidemiological information. The expected result is Negative.  Fact Sheet for Patients:  PinkCheek.be  Fact Sheet for Healthcare Providers:  GravelBags.it  This test is no t yet approved or cleared by the [place] FDA and  has been authorized for detection and/or diagnosis of SARS-CoV-2 by FDA under an Emergency Use Authorization (EUA). This EUA will remain  in effect (meaning this test can be used) for the duration of the COVID-19 declaration under Section 564(b)(1) of the Act, 21 U.S.C. section 360bbb-3(b)(1), unless the authorization is terminated or revoked  sooner.     Influenza A by PCR NEGATIVE NEGATIVE Final   Influenza B by PCR NEGATIVE NEGATIVE Final    Comment: (NOTE) The Xpert Xpress SARS-CoV-2/FLU/RSV assay is intended as an aid in  the diagnosis of influenza from Nasopharyngeal swab specimens and  should not be used as a sole basis for treatment. Nasal washings and  aspirates are unacceptable for Xpert Xpress SARS-CoV-2/FLU/RSV  testing.  Fact Sheet for Patients: PinkCheek.be  Fact Sheet for Healthcare Providers: GravelBags.it  This test is not yet approved or cleared by the [place] FDA and  has been authorized for detection and/or diagnosis of SARS-CoV-2 by  FDA under an Emergency Use Authorization (EUA). This EUA will remain  in effect (meaning this test can be used) for the duration of the  Covid-19 declaration under Section 564(b)(1) of the Act, 21  U.S.C. section 360bbb-3(b)(1), unless the authorization is  terminated or revoked. Performed at [facility], [place] [address], [place], [place] [address]       Studies: No results found.  Scheduled Meds: . feeding supplement  1 Container Oral TID BM  . ferrous sulfate  325 mg Oral Q breakfast  . pneumococcal 23 valent vaccine  0.5 mL Intramuscular Tomorrow-1000  . saccharomyces boulardii  250 mg Oral BID    Continuous Infusions: . clindamycin (CLEOCIN) IV 600 mg (04/06/20 0520)  . lactated ringers 125 mL/hr at 04/05/20 1824     LOS: 5 days  Kayleen Memos, MD [facility] Pager [phone]  If 7PM-7AM, please contact night-coverage [email or web address] Password [facility] 04/06/2020, 7:28 AM

## 2020-04-06 NOTE — Progress Notes (Signed)
ENT CONSULT NOTE   HPI: Patient admitted with right into tonsillar abscess. She has been treated with IV antibiotics and steroids. She reports significant improvement in her throat pain and symptoms on examination today. She is tolerating a regular diet without difficulty. She denies any dysphasia, odynophagia. She denies previous history of tonsillar abscess.  Examination reveals an awake alert oriented female in no acute d istress. Tonsils 2+, mild erythema noted, cryptic. No palatal fullness, no uvular deviation, no trismus. No evidence of drainable abscess. Voice intact with clear speech. Reactive cervical adenopathy noted.   A/P: Ashley Hawkins is a 19 y/o F with right intra-tonsillar abscess in setting of acute tonsillitis, clinically improved following treatment with IV antibiotics and Decadron. - repeat CT scan demonstrates interval improve ment in intra-tonsillar abscess - no evidence of drainable abscess on examination today - patient stable for discharge from ENT perspective, recommend discharge with clindamycin 300 mg QID for additional seven days, Medrol Dosepak - recommend outpatient follow up with ENT in 2-3 weeks once acute infectious process has completely resolved to discuss elective tonsillectomy - Medical management as primary team  Thank you  for allowing me to participate in the care of this patient, please do not hesitate to contact me with any questions or concerns.  Tabria Steines A Nyx Keady, DO [facility] ENT

## 2020-04-07 LAB — CBC WITH DIFFERENTIAL/PLATELET
Abs Immature Granulocytes: 0.39 10*3/uL — ABNORMAL HIGH (ref 0.00–0.07)
Basophils Absolute: 0.1 10*3/uL (ref 0.0–0.1)
Basophils Relative: 1 %
Eosinophils Absolute: 0.3 10*3/uL (ref 0.0–0.5)
Eosinophils Relative: 2 %
HCT: 25.3 % — ABNORMAL LOW (ref 36.0–46.0)
Hemoglobin: 9.1 g/dL — ABNORMAL LOW (ref 12.0–15.0)
Immature Granulocytes: 2 %
Lymphocytes Relative: 22 %
Lymphs Abs: 3.9 10*3/uL (ref 0.7–4.0)
MCH: 27.4 pg (ref 26.0–34.0)
MCHC: 36 g/dL (ref 30.0–36.0)
MCV: 76.2 fL — ABNORMAL LOW (ref 80.0–100.0)
Monocytes Absolute: 1 10*3/uL (ref 0.1–1.0)
Monocytes Relative: 6 %
Neutro Abs: 12.1 10*3/uL — ABNORMAL HIGH (ref 1.7–7.7)
Neutrophils Relative %: 67 %
Platelets: 219 10*3/uL (ref 150–400)
RBC: 3.32 MIL/uL — ABNORMAL LOW (ref 3.87–5.11)
RDW: 17.1 % — ABNORMAL HIGH (ref 11.5–15.5)
WBC: 17.9 10*3/uL — ABNORMAL HIGH (ref 4.0–10.5)
nRBC: 0.6 % — ABNORMAL HIGH (ref 0.0–0.2)

## 2020-04-07 MED ORDER — METHYLPREDNISOLONE 4 MG PO TBPK: ORAL_TABLET | ORAL | 0 refills | Status: DC

## 2020-04-07 MED ORDER — CLINDAMYCIN HCL 150 MG PO CAPS: 300.0000 mg | ORAL_CAPSULE | Freq: Four times a day (QID) | ORAL | 0 refills | Status: AC

## 2020-04-07 NOTE — Discharge Summary (Signed)
Physician Discharge Summary  Ashley Hawkins UJW:[id number] DOB: Jan 22, 2001 DOA: 04/01/2020  PCP: Marcine Matar, MD  Admit date: 04/01/2020 Discharge date: 04/07/2020  Admitted From: Home Disposition: Home  Recommendations for Outpatient Follow-up:  1. Follow up with PCP in 1-2 weeks 2. Schedule follow-up with ENT.  Home Health: Not applicable Equipment/Devices: Not needed  Discharge Condition: Stable CODE STATUS: Full code Diet recommendation: Regular diet  Discharge summary:  19 year old female with history of sickle cell trait and chronic anemia admitted to the hospital with fever and sore throat for 1 week.  Seen at urgent care negative strep test and treated with amoxicillin without improvement.  Came to ER with difficulty eating and developed nausea and vomiting.  Covid negative.  Repeat strep negative.  In the emergency room white count 26,000.  CT scan of the neck with right tonsillar abscess.  Followed by ENT.  Admitted and treated with IV clindamycin and steroids with clinical improvement.  Sepsis present on admission secondary to right tonsillar abscess, improved on repeat imaging: Sepsis resolved. Treated with IV clindamycin for 5 days.  Adequately improved and now asymptomatic.  As per ENT plan, discharging home with 7 more days of clindamycin and Medrol Dosepak.  Other symptomatic treatment.  She will follow up with ENT to discuss further surgical plan if needed.  This is her first episode. Her sepsis physiology has resolved.  Leukocytosis is likely due to high-dose steroids.  Chronic microcytic anemia.  Has chronic anemia.  She was given a dose of IV Feraheme in the hospital.  Hemoglobin is stable.  She is on chronic iron therapy that she will continue.  Patient up about in the room.  Ready to go home.    Discharge Diagnoses:  Principal Problem:   Tonsillar abscess Active Problems:   Fever   Leukocytosis   SIRS (systemic inflammatory response syndrome)  [facility])    Discharge Instructions  Discharge Instructions    Call MD for:  difficulty breathing, headache or visual disturbances   Complete by: As directed    Call MD for:  temperature >100.4   Complete by: As directed    Diet general   Complete by: As directed    Discharge instructions   Complete by: As directed    Salt water gargle  Call to schedule follow up with ENT   Increase activity slowly   Complete by: As directed      Allergies as of 04/07/2020      Reactions   Codeine Hives      Medication List    STOP taking these medications   ciprofloxacin 500 MG tablet Commonly known as: CIPRO   etonogestrel 68 MG Impl implant Commonly known as: NEXPLANON   ibuprofen 800 MG tablet Commonly known as: ADVIL   medroxyPROGESTERone 150 MG/ML injection Commonly known as: DEPO-PROVERA     TAKE these medications   acetaminophen 500 MG tablet Commonly known as: TYLENOL Take 1,000 mg by mouth every 6 (six) hours as needed for mild pain.   clindamycin 150 MG capsule Commonly known as: Cleocin Take 2 capsules (300 mg total) by mouth 4 (four) times daily for 7 days.   ferrous sulfate 325 (65 FE) MG tablet Take 1 tablet (325 mg total) by mouth daily with breakfast.   methylPREDNISolone 4 MG Tbpk tablet Commonly known as: MEDROL DOSEPAK Tapering dose as in instructions   ondansetron 4 MG disintegrating tablet Commonly known as: Zofran ODT Take 1 tablet (4 mg total) by mouth every 8 (  eight) hours as needed for nausea or vomiting.   saccharomyces boulardii 250 MG capsule Commonly known as: FLORASTOR Take 1 capsule (250 mg total) by mouth 2 (two) times daily for 20 days.       Follow-up Information    Marcine Matar, MD. Call in 1 day(s).   Specialty: Internal Medicine Why: Please call for a post hospital follow up appointment Contact information: [address] [place] [address] [phone]        Serena Colonel, MD. Call in 1 day(s).   Specialty:  Otolaryngology Why: please call for a post hospital follow up appointment Contact information: [address] [place] [address] [phone]              Allergies  Allergen Reactions  . Codeine Hives    Consultations:  ENT   Procedures/Studies: CT SOFT TISSUE NECK W CONTRAST  Result Date: 04/06/2020 CLINICAL DATA:  Follow-up peritonsillar abscess. EXAM: CT NECK WITH CONTRAST TECHNIQUE: Multidetector CT imaging of the neck was performed using the standard protocol following the bolus administration of intravenous contrast. CONTRAST:  65mL OMNIPAQUE IOHEXOL 300 MG/ML  SOLN COMPARISON:  CT neck 04/01/2020 FINDINGS: Pharynx and larynx: Interval improvement in right peritonsillar fluid collection. Fluid collection now measures 13 x 21 mm compared with 28 x 26 mm previously. The fluid collection has a crescentic shape. No airway compromise. Negative left tonsil. Epiglottis and larynx normal. Salivary glands: No inflammation, mass, or stone. Thyroid: Negative Lymph nodes: Improvement in bilateral lymph nodes right greater than left. Right level 2 lymph nodes measure 11 mm and 11 mm. Subcentimeter posterior lymph nodes on the right. 1 cm left level 2 lymph node. Vascular: Normal vascular enhancement. Limited intracranial: Negative Visualized orbits: Negative Mastoids and visualized paranasal sinuses: Mucosal edema paranasal sinuses without air-fluid level. Mastoid clear bilaterally. Skeleton: No focal skeletal abnormality. Upper chest: Lung apices clear bilaterally. Other: None IMPRESSION: Interval improvement in right peritonsillar abscess now measuring 13 x 21 mm. Decreased surrounding edema. Improving cervical adenopathy. Electronically Signed   By: Ashley Hawkins M.D.   On: 04/06/2020 13:18   CT Soft Tissue Neck W Contrast  Result Date: 04/01/2020 CLINICAL DATA:  18 year old female with sore throat for 6 days. Jaw and neck pain. Tested negative for strep and COVID-19.  EXAM: CT NECK WITH CONTRAST TECHNIQUE: Multidetector CT imaging of the neck was performed using the standard protocol following the bolus administration of intravenous contrast. CONTRAST:  36mL OMNIPAQUE IOHEXOL 300 MG/ML  SOLN COMPARISON:  None. FINDINGS: Pharynx and larynx: Fairly extensive right parapharyngeal space edema, soft tissue inflammation. The larynx is least affected. At the right palatine tonsil there is a relatively large although indistinct round hypodense area measuring up to 2.8 cm diameter (series 2, image 28 and sagittal image 37) consistent with right tonsillar abscess. The left parapharyngeal space remains normal. There is a small retropharyngeal effusion throughout. Salivary glands: Secondary inflammation also in the right submandibular space. The sublingual space is relatively spared at this time. Mildly heterogeneous enhancement of the right submandibular gland compared to the left. Parotid glands remain within normal limits. Thyroid: Negative. Lymph nodes: Reactive appearing bilateral cervical lymphadenopathy. Enhancing nodes on the right measure up to 16 mm short axis individually. No cystic or necrotic nodes. Vascular: The major vascular structures in the neck and at the skull base remain patent including the right IJ. Limited intracranial: Negative. Visualized orbits: Negative. Mastoids and visualized paranasal sinuses: Mild bubbly opacity in the left sphenoid  sinus. Minor right maxillary mucosal thickening. Well aerated sinuses otherwise. Tympanic cavities and visible mastoids are clear. Skeleton: No acute dental finding. No osseous abnormality identified. Upper chest: Negative. IMPRESSION: 1. Positive for a large Right Tonsillar Abscess, up to 2.8 cm diameter. 2. Widespread right parapharyngeal inflammation. Small retropharyngeal effusion. Secondary inflammation in the right submandibular space also. 3. Right greater than left reactive cervical lymphadenopathy. Electronically Signed    By: Odessa Fleming M.D.   On: 04/01/2020 18:34   DG Chest Port 1 View  Result Date: 04/01/2020 CLINICAL DATA:  19 year old female with concern for sepsis. EXAM: PORTABLE CHEST 1 VIEW COMPARISON:  Chest radiograph dated 12/18/2019. FINDINGS: The heart size and mediastinal contours are within normal limits. Both lungs are clear. The visualized skeletal structures are unremarkable. IMPRESSION: No active disease. Electronically Signed   By: Elgie Collard M.D.   On: 04/01/2020 16:56   (Echo, Carotid, EGD, Colonoscopy, ERCP)    Subjective: Patient seen and examined.  Denies any overnight events.  She was very eager to leave.  Denies any cough, throat pain or difficulty swallowing.  Mom on the phone.   Discharge Exam: Vitals:   04/06/20 2144 04/07/20 0536  BP: 130/65 127/71  Pulse: 72 61  Resp: 17 16  Temp: 98.4 F (36.9 C) 98.6 F (37 C)  SpO2: 100% 100%   Vitals:   04/06/20 1000 04/06/20 1744 04/06/20 2144 04/07/20 0536  BP: 117/72 126/74 130/65 127/71  Pulse: 74 72 72 61  Resp: 14 18 17 16   Temp: 98.7 F (37.1 C) 98.3 F (36.8 C) 98.4 F (36.9 C) 98.6 F (37 C)  TempSrc: Oral Oral Oral Oral  SpO2: 100% 100% 100% 100%  Weight:      Height:        General: Pt is alert, awake, not in acute distress No visible swelling on the oropharynx. Cardiovascular: RRR, S1/S2 +, no rubs, no gallops Respiratory: CTA bilaterally, no wheezing, no rhonchi Abdominal: Soft, NT, ND, bowel sounds + Extremities: no edema, no cyanosis    The results of significant diagnostics from this hospitalization (including imaging, microbiology, ancillary and laboratory) are listed below for reference.     Microbiology: Recent Results (from the past 240 hour(s))  Respiratory Panel by RT PCR (Flu A&B, Covid) - Nasopharyngeal Swab     Status: None   Collection Time: 03/29/20 10:40 AM   Specimen: Nasopharyngeal Swab  Result Value Ref Range Status   SARS Coronavirus 2 by RT PCR NEGATIVE NEGATIVE Final     Comment: (NOTE) SARS-CoV-2 target nucleic acids are NOT DETECTED.  The SARS-CoV-2 RNA is generally detectable in upper respiratoy specimens during the acute phase of infection. The lowest concentration of SARS-CoV-2 viral copies this assay can detect is 131 copies/mL. A negative result does not preclude SARS-Cov-2 infection and should not be used as the sole basis for treatment or other patient management decisions. A negative result may occur with  improper specimen collection/handling, submission of specimen other than nasopharyngeal swab, presence of viral mutation(s) within the areas targeted by this assay, and inadequate number of viral copies (<131 copies/mL). A negative result must be combined with clinical observations, patient history, and epidemiological information. The expected result is Negative.  Fact Sheet for Patients:  13/08/21  Fact Sheet for Healthcare Providers:  https://www.moore.com/  This test is no t yet approved or cleared by the https://www.young.biz/ FDA and  has been authorized for detection and/or diagnosis of SARS-CoV-2 by FDA under an Emergency Use Authorization (  EUA). This EUA will remain  in effect (meaning this test can be used) for the duration of the COVID-19 declaration under Section 564(b)(1) of the Act, 21 U.S.C. section 360bbb-3(b)(1), unless the authorization is terminated or revoked sooner.     Influenza A by PCR NEGATIVE NEGATIVE Final   Influenza B by PCR NEGATIVE NEGATIVE Final    Comment: (NOTE) The Xpert Xpress SARS-CoV-2/FLU/RSV assay is intended as an aid in  the diagnosis of influenza from Nasopharyngeal swab specimens and  should not be used as a sole basis for treatment. Nasal washings and  aspirates are unacceptable for Xpert Xpress SARS-CoV-2/FLU/RSV  testing.  Fact Sheet for Patients: https://www.moore.com/  Fact Sheet for Healthcare  Providers: https://www.young.biz/  This test is not yet approved or cleared by the [place] FDA and  has been authorized for detection and/or diagnosis of SARS-CoV-2 by  FDA under an Emergency Use Authorization (EUA). This EUA will remain  in effect (meaning this test can be used) for the duration of the  Covid-19 declaration under Section 564(b)(1) of the Act, 21  U.S.C. section 360bbb-3(b)(1), unless the authorization is  terminated or revoked. Performed at [facility], [address] [place], [place] [address]   Urine Culture     Status: None   Collection Time: 03/29/20  1:08 PM   Specimen: Urine, Random  Result Value Ref Range Status   Specimen Description   Final    URINE, RANDOM Performed at [facility], [address] [place], [place] [address]    Special Requests   Final    NONE Performed at [facility], [address], [place], [place] [address]    Culture   Final    NO GROWTH Performed at [facility], [address], [place], [place] [address]    Report Status 03/31/2020 FINAL  Final  Blood Culture (routine x 2)     Status: None   Collection Time: 04/01/20  4:21 PM   Specimen: BLOOD  Result Value Ref Range Status   Specimen Description   Final    BLOOD RIGHT ANTECUBITAL Performed at [facility], [address] [place], [place] [address]    Special Requests   Final    BOTTLES DRAWN AEROBIC AND ANAEROBIC Blood Culture adequate volume Performed at [facility], [address], [place], [place] [address]    Culture   Final    NO GROWTH 5 DAYS Performed at [facility], [address], [place], [place] [address]    Report Status 04/06/2020 FINAL  Final  Blood Culture (routine x 2)     Status: None   Collection Time: 04/01/20  4:26 PM   Specimen: BLOOD  Result Value Ref Range Status   Specimen Description   Final    BLOOD  RIGHT WRIST Performed at [facility], [address], [place], [place] [address]    Special Requests   Final    BOTTLES DRAWN AEROBIC AND ANAEROBIC Blood Culture results may not be optimal due to an excessive volume of blood received in culture bottles Performed at [facility], [address] [place], [place] [address]    Culture   Final    NO GROWTH 5 DAYS Performed at [facility], [address], [place], [place] [address]    Report Status 04/06/2020 FINAL  Final  Urine culture     Status: None   Collection  Time: 04/01/20  4:44 PM   Specimen: In/Out Cath Urine  Result Value Ref Range Status   Specimen Description   Final    IN/OUT CATH URINE Performed at [facility], [address], [place], [place] [address]    Special Requests   Final    NONE Performed at [facility], [address] [place], [place] [address]    Culture   Final    NO GROWTH Performed at [facility], [address], [place], [place] [address]    Report Status 04/02/2020 FINAL  Final  Respiratory Panel by RT PCR (Flu A&B, Covid) - Nasopharyngeal Swab     Status: None   Collection Time: 04/01/20  6:55 PM   Specimen: Nasopharyngeal Swab  Result Value Ref Range Status   SARS Coronavirus 2 by RT PCR NEGATIVE NEGATIVE Final    Comment: (NOTE) SARS-CoV-2 target nucleic acids are NOT DETECTED.  The SARS-CoV-2 RNA is generally detectable in upper respiratoy specimens during the acute phase of infection. The lowest concentration of SARS-CoV-2 viral copies this assay can detect is 131 copies/mL. A negative result does not preclude SARS-Cov-2 infection and should not be used as the sole basis for treatment or other patient management decisions. A negative result may occur with  improper specimen collection/handling, submission of specimen other than nasopharyngeal swab, presence of viral mutation(s) within the areas  targeted by this assay, and inadequate number of viral copies (<131 copies/mL). A negative result must be combined with clinical observations, patient history, and epidemiological information. The expected result is Negative.  Fact Sheet for Patients:  https://www.moore.com/  Fact Sheet for Healthcare Providers:  https://www.young.biz/  This test is no t yet approved or cleared by the [place] FDA and  has been authorized for detection and/or diagnosis of SARS-CoV-2 by FDA under an Emergency Use Authorization (EUA). This EUA will remain  in effect (meaning this test can be used) for the duration of the COVID-19 declaration under Section 564(b)(1) of the Act, 21 U.S.C. section 360bbb-3(b)(1), unless the authorization is terminated or revoked sooner.     Influenza A by PCR NEGATIVE NEGATIVE Final   Influenza B by PCR NEGATIVE NEGATIVE Final    Comment: (NOTE) The Xpert Xpress SARS-CoV-2/FLU/RSV assay is intended as an aid in  the diagnosis of influenza from Nasopharyngeal swab specimens and  should not be used as a sole basis for treatment. Nasal washings and  aspirates are unacceptable for Xpert Xpress SARS-CoV-2/FLU/RSV  testing.  Fact Sheet for Patients: https://www.moore.com/  Fact Sheet for Healthcare Providers: https://www.young.biz/  This test is not yet approved or cleared by the [place] FDA and  has been authorized for detection and/or diagnosis of SARS-CoV-2 by  FDA under an Emergency Use Authorization (EUA). This EUA will remain  in effect (meaning this test can be used) for the duration of the  Covid-19 declaration under Section 564(b)(1) of the Act, 21  U.S.C. section 360bbb-3(b)(1), unless the authorization is  terminated or revoked. Performed at [facility], [address] [place], [place] [address]      Labs: BNP (last 3 results) No results for  input(s): BNP in the last 8760 hours. Basic Metabolic Panel: Recent Labs  Lab 04/01/20 1355 04/02/20 0245 04/03/20 0632  NA 136 135 135  K 3.5 3.4* 3.9  CL 102 103 105  CO2 GLUCOSE 103* 104* 152*  BUN 9 <5* 8  CREATININE 0.59 0.46 0.48  CALCIUM 9.0 8.2* 8.7*  MG  --   --  2.0  PHOS  --   --  3.6   Liver Function Tests: Recent Labs  Lab 04/01/20 1355  AST 16  ALT 24  ALKPHOS 57  BILITOT 2.7*  PROT 8.9*  ALBUMIN 4.0   No results for input(s): LIPASE, AMYLASE in the last 168 hours. No results for input(s): AMMONIA in the last 168 hours. CBC: Recent Labs  Lab 04/03/20 0632 04/04/20 0551 04/05/20 0548 04/06/20 0519 04/07/20 0528  WBC 19.7* 19.2* 15.4* 15.9* 17.9*  NEUTROABS 16.4* 12.4* 8.9* 10.4* 12.1*  HGB 9.1* 8.4* 8.5* 8.8* 9.1*  HCT 25.3* 23.2* 23.4* 24.6* 25.3*  MCV 76.0* 76.6* 76.2* 76.6* 76.2*  PLT 215 196 189 230 219   Cardiac Enzymes: No results for input(s): CKTOTAL, CKMB, CKMBINDEX, TROPONINI in the last 168 hours. BNP: Invalid input(s): POCBNP CBG: No results for input(s): GLUCAP in the last 168 hours. D-Dimer No results for input(s): DDIMER in the last 72 hours. Hgb A1c No results for input(s): HGBA1C in the last 72 hours. Lipid Profile No results for input(s): CHOL, HDL, LDLCALC, TRIG, CHOLHDL, LDLDIRECT in the last 72 hours. Thyroid function studies No results for input(s): TSH, T4TOTAL, T3FREE, THYROIDAB in the last 72 hours.  Invalid input(s): FREET3 Anemia work up No results for input(s): VITAMINB12, FOLATE, FERRITIN, TIBC, IRON, RETICCTPCT in the last 72 hours. Urinalysis    Component Value Date/Time   COLORURINE YELLOW 04/01/2020 1644   APPEARANCEUR HAZY (A) 04/01/2020 1644   LABSPEC 1.024 04/01/2020 1644   PHURINE 5.0 04/01/2020 1644   GLUCOSEU NEGATIVE 04/01/2020 1644   HGBUR LARGE (A) 04/01/2020 1644   BILIRUBINUR NEGATIVE 04/01/2020 1644   KETONESUR 80 (A) 04/01/2020 1644   PROTEINUR 100 (A) 04/01/2020 1644    UROBILINOGEN 1.0 10/12/2008 0145   NITRITE NEGATIVE 04/01/2020 1644   LEUKOCYTESUR NEGATIVE 04/01/2020 1644   Sepsis Labs Invalid input(s): PROCALCITONIN,  WBC,  LACTICIDVEN Microbiology Recent Results (from the past 240 hour(s))  Respiratory Panel by RT PCR (Flu A&B, Covid) - Nasopharyngeal Swab     Status: None   Collection Time: 03/29/20 10:40 AM   Specimen: Nasopharyngeal Swab  Result Value Ref Range Status   SARS Coronavirus 2 by RT PCR NEGATIVE NEGATIVE Final    Comment: (NOTE) SARS-CoV-2 target nucleic acids are NOT DETECTED.  The SARS-CoV-2 RNA is generally detectable in upper respiratoy specimens during the acute phase of infection. The lowest concentration of SARS-CoV-2 viral copies this assay can detect is 131 copies/mL. A negative result does not preclude SARS-Cov-2 infection and should not be used as the sole basis for treatment or other patient management decisions. A negative result may occur with  improper specimen collection/handling, submission of specimen other than nasopharyngeal swab, presence of viral mutation(s) within the areas targeted by this assay, and inadequate number of viral copies (<131 copies/mL). A negative result must be combined with clinical observations, patient history, and epidemiological information. The expected result is Negative.  Fact Sheet for Patients:  https://www.moore.com/https://www.fda.gov/media/142436/download  Fact Sheet for Healthcare Providers:  https://www.young.biz/https://www.fda.gov/media/142435/download  This test is no t yet approved or cleared by the [place] FDA and  has been authorized for detection and/or diagnosis of SARS-CoV-2 by FDA under an Emergency Use Authorization (EUA). This EUA will remain  in effect (meaning this test can be used) for the duration of the COVID-19 declaration under Section 564(b)(1) of the Act, 21 U.S.C. section 360bbb-3(b)(1), unless the authorization is terminated or revoked sooner.  Influenza A by PCR NEGATIVE  NEGATIVE Final   Influenza B by PCR NEGATIVE NEGATIVE Final    Comment: (NOTE) The Xpert Xpress SARS-CoV-2/FLU/RSV assay is intended as an aid in  the diagnosis of influenza from Nasopharyngeal swab specimens and  should not be used as a sole basis for treatment. Nasal washings and  aspirates are unacceptable for Xpert Xpress SARS-CoV-2/FLU/RSV  testing.  Fact Sheet for Patients: https://www.moore.com/  Fact Sheet for Healthcare Providers: https://www.young.biz/  This test is not yet approved or cleared by the [place] FDA and  has been authorized for detection and/or diagnosis of SARS-CoV-2 by  FDA under an Emergency Use Authorization (EUA). This EUA will remain  in effect (meaning this test can be used) for the duration of the  Covid-19 declaration under Section 564(b)(1) of the Act, 21  U.S.C. section 360bbb-3(b)(1), unless the authorization is  terminated or revoked. Performed at [facility], [address] [place], [place] [address]   Urine Culture     Status: None   Collection Time: 03/29/20  1:08 PM   Specimen: Urine, Random  Result Value Ref Range Status   Specimen Description   Final    URINE, RANDOM Performed at [facility], [address], [place], [place] [address]    Special Requests   Final    NONE Performed at [facility], [address] [place], [place] [address]    Culture   Final    NO GROWTH Performed at [facility], [address], [place], [place] [address]    Report Status 03/31/2020 FINAL  Final  Blood Culture (routine x 2)     Status: None   Collection Time: 04/01/20  4:21 PM   Specimen: BLOOD  Result Value Ref Range Status   Specimen Description   Final    BLOOD RIGHT ANTECUBITAL Performed at [facility], [address] [place], [place] [address]    Special Requests   Final    BOTTLES DRAWN AEROBIC AND  ANAEROBIC Blood Culture adequate volume Performed at [facility], [address] [place], [place] [address]    Culture   Final    NO GROWTH 5 DAYS Performed at [facility], [address], [place], [place] [address]    Report Status 04/06/2020 FINAL  Final  Blood Culture (routine x 2)     Status: None   Collection Time: 04/01/20  4:26 PM   Specimen: BLOOD  Result Value Ref Range Status   Specimen Description   Final    BLOOD RIGHT WRIST Performed at [facility], [address] [place], [place] [address]    Special Requests   Final    BOTTLES DRAWN AEROBIC AND ANAEROBIC Blood Culture results may not be optimal due to an excessive volume of blood received in culture bottles Performed at [facility], [address], [place], [place] [address]    Culture   Final    NO GROWTH 5 DAYS Performed at [facility], [address] [place], [place] [address]    Report Status 04/06/2020 FINAL  Final  Urine culture     Status: None   Collection Time: 04/01/20  4:44 PM   Specimen: In/Out Cath Urine  Result Value Ref Range Status   Specimen Description   Final    IN/OUT CATH URINE Performed at [facility], [address] [place], [place] [address]  Special Requests   Final    NONE Performed at [facility], [address], [place], [place] [address]    Culture   Final    NO GROWTH Performed at [facility], [address] [place], [place] [address]    Report Status 04/02/2020 FINAL  Final  Respiratory Panel by RT PCR (Flu A&B, Covid) - Nasopharyngeal Swab     Status: None   Collection Time: 04/01/20  6:55 PM   Specimen: Nasopharyngeal Swab  Result Value Ref Range Status   SARS Coronavirus 2 by RT PCR NEGATIVE NEGATIVE Final    Comment: (NOTE) SARS-CoV-2 target nucleic acids are NOT DETECTED.  The SARS-CoV-2 RNA is generally detectable in upper  respiratoy specimens during the acute phase of infection. The lowest concentration of SARS-CoV-2 viral copies this assay can detect is 131 copies/mL. A negative result does not preclude SARS-Cov-2 infection and should not be used as the sole basis for treatment or other patient management decisions. A negative result may occur with  improper specimen collection/handling, submission of specimen other than nasopharyngeal swab, presence of viral mutation(s) within the areas targeted by this assay, and inadequate number of viral copies (<131 copies/mL). A negative result must be combined with clinical observations, patient history, and epidemiological information. The expected result is Negative.  Fact Sheet for Patients:  https://www.moore.com/  Fact Sheet for Healthcare Providers:  https://www.young.biz/  This test is no t yet approved or cleared by the [place] FDA and  has been authorized for detection and/or diagnosis of SARS-CoV-2 by FDA under an Emergency Use Authorization (EUA). This EUA will remain  in effect (meaning this test can be used) for the duration of the COVID-19 declaration under Section 564(b)(1) of the Act, 21 U.S.C. section 360bbb-3(b)(1), unless the authorization is terminated or revoked sooner.     Influenza A by PCR NEGATIVE NEGATIVE Final   Influenza B by PCR NEGATIVE NEGATIVE Final    Comment: (NOTE) The Xpert Xpress SARS-CoV-2/FLU/RSV assay is intended as an aid in  the diagnosis of influenza from Nasopharyngeal swab specimens and  should not be used as a sole basis for treatment. Nasal washings and  aspirates are unacceptable for Xpert Xpress SARS-CoV-2/FLU/RSV  testing.  Fact Sheet for Patients: https://www.moore.com/  Fact Sheet for Healthcare Providers: https://www.young.biz/  This test is not yet approved or cleared by the [place] FDA and  has been  authorized for detection and/or diagnosis of SARS-CoV-2 by  FDA under an Emergency Use Authorization (EUA). This EUA will remain  in effect (meaning this test can be used) for the duration of the  Covid-19 declaration under Section 564(b)(1) of the Act, 21  U.S.C. section 360bbb-3(b)(1), unless the authorization is  terminated or revoked. Performed at [facility], [address], [place], [place] [address]      Time coordinating discharge:  32 minutes  SIGNED:   Dorcas Carrow, MD  [facility] 04/07/2020, 11:12 AM

## 2020-04-07 NOTE — Progress Notes (Signed)
Discharge instructions given to pt and all questions were answered.  

## 2020-04-08 ENCOUNTER — Telehealth: Payer: Self-pay

## 2020-04-08 NOTE — Telephone Encounter (Signed)
Transition Care Management Unsuccessful Follow-up Telephone Call  Date of discharge and from where:  04/07/2020, [facility]   Attempts:  1st Attempt  Reason for unsuccessful TCM follow-up call:  Left voice message - call placed to # [phone] with call back requested to this CM.   Dr Laural Benes is listed as patient's PCP but she has never seen the patient.  Need to discuss scheduling hospital follow up appointment.

## 2020-04-09 ENCOUNTER — Telehealth: Payer: Self-pay

## 2020-04-09 NOTE — Telephone Encounter (Signed)
Transition Care Management Unsuccessful Follow-up Telephone Call  Date of discharge and from where:  04/07/2020, [facility]   Attempts:  2 nd Attempt  Reason for unsuccessful TCM follow-up call:  Left voice message - call placed to # [phone] with call back requested to this CM.   Dr Laural Benes is listed as patient's PCP but she has never seen the patient.  Need to discuss scheduling hospital follow up appointment.

## 2020-04-12 ENCOUNTER — Telehealth: Payer: Self-pay

## 2020-04-12 NOTE — Telephone Encounter (Signed)
Letter sent to patient requesting she contact [facility] to schedule hospital follow up appointment as we have not been able to reach her.

## 2020-04-29 ENCOUNTER — Telehealth: Payer: Self-pay | Admitting: Internal Medicine

## 2020-04-29 NOTE — Telephone Encounter (Signed)
Called patient and LVM alerting patient that her appointment for 12/14 with Amy Zonia Kief had been cancelled since Amy no longer works at our office. I advised patient to call back [phone] to reschedule with another provider, or schedule with Amy at [facility].

## 2020-05-04 ENCOUNTER — Inpatient Hospital Stay: Payer: 59 | Admitting: Family

## 2020-05-04 ENCOUNTER — Inpatient Hospital Stay: Payer: 59 | Admitting: Family Medicine

## 2020-05-04 ENCOUNTER — Telehealth: Payer: 59 | Admitting: Family

## 2020-05-06 ENCOUNTER — Ambulatory Visit: Payer: 59 | Attending: Family Medicine | Admitting: Internal Medicine

## 2020-05-06 ENCOUNTER — Other Ambulatory Visit: Payer: Self-pay

## 2020-05-06 ENCOUNTER — Encounter: Payer: Self-pay | Admitting: Internal Medicine

## 2020-05-06 VITALS — BP 103/67 | HR 85 | Temp 98.4°F | Resp 16 | Ht 65.0 in | Wt 137.2 lb

## 2020-05-06 DIAGNOSIS — D509 Iron deficiency anemia, unspecified: Secondary | ICD-10-CM | POA: Insufficient documentation

## 2020-05-06 DIAGNOSIS — Z09 Encounter for follow-up examination after completed treatment for conditions other than malignant neoplasm: Secondary | ICD-10-CM | POA: Diagnosis not present

## 2020-05-06 DIAGNOSIS — J36 Peritonsillar abscess: Secondary | ICD-10-CM | POA: Diagnosis not present

## 2020-05-06 DIAGNOSIS — D573 Sickle-cell trait: Secondary | ICD-10-CM

## 2020-05-06 DIAGNOSIS — Z23 Encounter for immunization: Secondary | ICD-10-CM | POA: Insufficient documentation

## 2020-05-06 HISTORY — DX: Sickle-cell trait: D57.3

## 2020-05-06 NOTE — Patient Instructions (Signed)
Please return here to the lab tomorrow or next week to have your blood test done.  You can request the flu shot at the same time.  You will be called with the appointment to follow-up with the ear nose and throat specialist Dr. Pollyann Kennedy.

## 2020-05-06 NOTE — Progress Notes (Signed)
Patient ID: Ashley Hawkins, female    DOB: 2000-12-25  MRN: [id number]  CC: Hospitalization Follow-up   Subjective: Ashley Hawkins is a 19 y.o. female who presents for hospital follow-up and to establish care. Her concerns today include:  Patient with history of sickle cell trait.  Patient hospitalized 11/11-17/2021 with right tonsillar abscess.  She was followed by ENT Dr. Pollyann Kennedy.  Treated with IV clindamycin and steroids with clinical improvement.  She was discharged home with 7 more days of clindamycin and Medrol pack.  She was to follow-up with ENT.  She did have a leukocytosis that was thought to be partially due to high-dose steroids.  She also noted to have chronic microcytic anemia.  She was given a dose of IV Feraheme in the hospital.  Hemoglobin was stable at the time of discharge.  She was discharged on iron supplement.  Today: Patient states she is doing much better.  She still have a little swelling of the right tonsils but no pain, fever, problems swallowing or breathing.  She did not have a follow-up appointment with Dr. Pollyann Kennedy.  In regards to her anemia.  She has history of sickle cell trait.  She has an Implanon implant for birth control.  Reports that menses do not occur regularly because of this and when they do they are usually light.  She denies any fatigue or dizziness.  No blood in the stools.  Past medical, surgical, family history and social history reviewed.  Patient Active Problem List   Diagnosis Date Noted  . Influenza vaccine needed 05/06/2020  . Tonsillar abscess 04/01/2020  . Fever 04/01/2020  . Leukocytosis 04/01/2020  . SIRS (systemic inflammatory response syndrome) (HCC) 04/01/2020     Current Outpatient Medications on File Prior to Visit  Medication Sig Dispense Refill  . acetaminophen (TYLENOL) 500 MG tablet Take 1,000 mg by mouth every 6 (six) hours as needed for mild pain. (Patient not taking: Reported on 05/06/2020)    . ferrous sulfate 325  (65 FE) MG tablet Take 1 tablet (325 mg total) by mouth daily with breakfast. (Patient not taking: Reported on 05/06/2020) 90 tablet 0  . methylPREDNISolone (MEDROL DOSEPAK) 4 MG TBPK tablet Tapering dose as in instructions (Patient not taking: Reported on 05/06/2020) 21 tablet 0  . ondansetron (ZOFRAN ODT) 4 MG disintegrating tablet Take 1 tablet (4 mg total) by mouth every 8 (eight) hours as needed for nausea or vomiting. (Patient not taking: Reported on 05/06/2020) 10 tablet 0   Current Facility-Administered Medications on File Prior to Visit  Medication Dose Route Frequency Provider Last Rate Last Admin  . medroxyPROGESTERone (DEPO-PROVERA) injection 150 mg  150 mg Intramuscular Q90 days Brock Bad, MD   150 mg at 03/27/19 4098    Allergies  Allergen Reactions  . Codeine Hives    Social History   Socioeconomic History  . Marital status: Single    Spouse name: Not on file  . Number of children: Not on file  . Years of education: Not on file  . Highest education level: Not on file  Occupational History  . Not on file  Tobacco Use  . Smoking status: Passive Smoke Exposure - Never Smoker  . Smokeless tobacco: Never Used  Substance and Sexual Activity  . Alcohol use: No    Alcohol/week: 0.0 standard drinks  . Drug use: No  . Sexual activity: Not Currently    Partners: Male    Birth control/protection: Condom, Injection    Comment:  patient has been active 1 time  Other Topics Concern  . Not on file  Social History Narrative  . Not on file   Social Determinants of Health   Financial Resource Strain: Not on file  Food Insecurity: Not on file  Transportation Needs: Not on file  Physical Activity: Not on file  Stress: Not on file  Social Connections: Not on file  Intimate Partner Violence: Not on file    Family History  Problem Relation Age of Onset  . Hypertension Maternal Uncle   . Hypertension Maternal Grandmother     No past surgical history on  file.  ROS: Review of Systems Negative except as stated above  PHYSICAL EXAM: BP 103/67   Pulse 85   Temp 98.4 F (36.9 C)   Resp 16   Ht 5\' 5"  (1.651 m)   Wt 137 lb 3.2 oz (62.2 kg)   SpO2 99%   BMI 22.83 kg/m   Physical Exam  General appearance - alert, well appearing, young African-American female and in no distress Mental status - normal mood, behavior, speech, dress, motor activity, and thought processes Mouth -mild enlargement of the right tonsil.  No erythema or exudates on either tonsils. Neck - supple, no significant adenopathy Chest - clear to auscultation, no wheezes, rales or rhonchi, symmetric air entry Heart - normal rate, regular rhythm, normal S1, S2, no murmurs, rubs, clicks or gallops   CMP Latest Ref Rng & Units 04/03/2020 04/02/2020 04/01/2020  Glucose 70 - 99 mg/dL 13/03/2020) 254(Y) 706(C)  BUN 6 - 20 mg/dL 8 376(E) 9  Creatinine <8(B - 1.00 mg/dL 1.51 7.61 6.07  Sodium 135 - 145 mmol/L 135 135 136  Potassium 3.5 - 5.1 mmol/L 3.9 3.4(L) 3.5  Chloride 98 - 111 mmol/L 105 103 102  CO2 22 - 32 mmol/L 23 24 23   Calcium 8.9 - 10.3 mg/dL 3.71) ) 9.0  Total Protein 6.5 - 8.1 g/dL - - 8.9(H)  Total Bilirubin 0.3 - 1.2 mg/dL - - 2.7(H)  Alkaline Phos 38 - 126 U/L - - 57  AST 15 - 41 U/L - - 16  ALT 0 - 44 U/L - - 24   Lipid Panel  No results found for: CHOL, TRIG, HDL, CHOLHDL, VLDL, LDLCALC, LDLDIRECT  CBC    Component Value Date/Time   WBC 17.9 (H) 04/07/2020 0528   RBC 3.32 (L) 04/07/2020 0528   HGB 9.1 (L) 04/07/2020 0528   HCT 25.3 (L) 04/07/2020 0528   PLT 219 04/07/2020 0528   MCV 76.2 (L) 04/07/2020 0528   MCH 27.4 04/07/2020 0528   MCHC 36.0 04/07/2020 0528   RDW 17.1 (H) 04/07/2020 0528   LYMPHSABS 3.9 04/07/2020 0528   MONOABS 1.0 04/07/2020 0528   EOSABS 0.3 04/07/2020 0528   BASOSABS 0.1 04/07/2020 0528    ASSESSMENT AND PLAN:  1. Hospital discharge follow-up 2. Tonsillar abscess Resolved.  However tonsil still remains a  little enlarged.  We will schedule follow-up with ENT.  3. Sickle cell trait (HCC) 4. Microcytic anemia -I wanted to recheck her CBC today and iron levels but patient was in a rush to go pick up her siblings.  She will return to the lab tomorrow or next week to have the blood test done. - CBC; Future - Iron, TIBC and Ferritin Panel; Future     Patient was given the opportunity to ask questions.  Patient verbalized understanding of the plan and was able to repeat key elements of the plan.  No orders of the defined types were placed in this encounter.    Requested Prescriptions    No prescriptions requested or ordered in this encounter    No follow-ups on file.  Karle Plumber, MD, FACP

## 2020-06-07 ENCOUNTER — Encounter (HOSPITAL_COMMUNITY): Payer: Self-pay | Admitting: Emergency Medicine

## 2020-06-07 ENCOUNTER — Emergency Department (HOSPITAL_COMMUNITY)
Admission: EM | Admit: 2020-06-07 | Discharge: 2020-06-07 | Disposition: A | Discharge status: Home / Self Care | Payer: 59 | Attending: Emergency Medicine | Admitting: Emergency Medicine

## 2020-06-07 ENCOUNTER — Other Ambulatory Visit: Payer: Self-pay

## 2020-06-07 DIAGNOSIS — R1110 Vomiting, unspecified: Secondary | ICD-10-CM | POA: Insufficient documentation

## 2020-06-07 DIAGNOSIS — Z5321 Procedure and treatment not carried out due to patient leaving prior to being seen by health care provider: Secondary | ICD-10-CM | POA: Diagnosis not present

## 2020-06-07 DIAGNOSIS — R1013 Epigastric pain: Secondary | ICD-10-CM | POA: Diagnosis not present

## 2020-06-07 NOTE — ED Triage Notes (Signed)
Pt BIB [facility] from home. Reports epigastric pain worse with movement. Reports that she played Wii Just Dance yesterday which was increased activity for her. Reports one episode of vomiting last night.

## 2020-06-07 NOTE — ED Notes (Signed)
Pt turned labels in to screener then left facility

## 2020-06-07 NOTE — ED Notes (Signed)
Pt returned labels to Patient Access and stated she was leaving.

## 2020-06-09 ENCOUNTER — Ambulatory Visit: Payer: 59 | Admitting: Obstetrics

## 2020-07-29 ENCOUNTER — Ambulatory Visit (INDEPENDENT_AMBULATORY_CARE_PROVIDER_SITE_OTHER): Payer: 59 | Admitting: Certified Nurse Midwife

## 2020-07-29 ENCOUNTER — Other Ambulatory Visit: Payer: Self-pay

## 2020-07-29 ENCOUNTER — Encounter: Payer: Self-pay | Admitting: Certified Nurse Midwife

## 2020-07-29 VITALS — BP 126/74 | HR 91 | Wt 138.8 lb

## 2020-07-29 DIAGNOSIS — Z3046 Encounter for surveillance of implantable subdermal contraceptive: Secondary | ICD-10-CM

## 2020-07-29 DIAGNOSIS — Z3009 Encounter for other general counseling and advice on contraception: Secondary | ICD-10-CM | POA: Diagnosis not present

## 2020-07-29 DIAGNOSIS — Z30013 Encounter for initial prescription of injectable contraceptive: Secondary | ICD-10-CM | POA: Diagnosis not present

## 2020-07-29 MED ORDER — MEDROXYPROGESTERONE ACETATE 150 MG/ML IM SUSP: 150.0000 mg | INTRAMUSCULAR | 0 refills | Status: DC

## 2020-07-29 MED ORDER — MEDROXYPROGESTERONE ACETATE 150 MG/ML IM SUSP
150.0000 mg | Freq: Once | INTRAMUSCULAR | Status: AC
  Administered 2020-07-29: 150 mg via INTRAMUSCULAR

## 2020-07-29 NOTE — Patient Instructions (Signed)
Contraceptive Injection A contraceptive injection is a shot that prevents pregnancy. It is also called a birth control shot. The shot contains the hormone progestin, which prevents pregnancy by:  Stopping the ovaries from releasing eggs.  Thickening cervical mucus to prevent sperm from entering the cervix.  Thinning the lining of the uterus to prevent a fertilized egg from attaching to the uterus. Contraceptive injections are given under the skin (subcutaneous) or into a muscle (intramuscular). For these shots to work, you must get one of them every 3 months (12-13 weeks) from a health care provider. Tell a health care provider about:  Any allergies you have.  All medicines you are taking, including vitamins, herbs, eye drops, creams, and over-the-counter medicines.  Any blood disorders you have.  Any medical conditions you have.  Whether you are pregnant or may be pregnant. What are the risks? Generally, this is a safe procedure. However, problems may occur, including:  Mood changes or depression.  Loss of bone density (osteoporosis) after long-term use.  Blood clots. This is rare.  Higher risk of an egg being fertilized outside your uterus (ectopic pregnancy).This is rare. What happens before the procedure?  Your health care provider may do a routine physical exam.  You may have a test to make sure you are not pregnant. What happens during the procedure?  The area where the shot will be given will be cleaned and sanitized with alcohol.  A needle will be inserted into a muscle in your upper arm or buttock, or into the skin of your thigh or abdomen. The needle will be attached to a syringe with the medicine inside of it.  The medicine will be pushed through the syringe and injected into your body.  A small bandage (dressing) may be placed over the injection site.   What can I expect after the procedure?  After the procedure, it is common to have: ? Soreness around the  injection site for a couple of days. ? Irregular menstrual bleeding. ? Weight gain. ? Breast tenderness. ? Headaches. ? Discomfort in your abdomen.  Ask your health care provider whether you need to use an added method of birth control (backup contraception), such as a condom, sponge, or spermicide. ? If the first shot is given 1-7 days after the start of your last menstrual period, you will not need backup contraception. ? If the first shot is given at any other time during your menstrual cycle, you should avoid having sex. If you do have sex, you will need to use backup contraception for 7 days after you receive the shot. Follow these instructions at home: General instructions  Take over-the-counter and prescription medicines only as told by your health care provider.  Do not rub or massage the injection site.  Track your menstrual periods so you will know if they become irregular.  Always use a condom to protect against sexually transmitted infections (STIs).  Make sure you schedule an appointment in time for your next shot and mark it on your calendar. You must get an injection every 3 months (12-13 weeks) to prevent pregnancy. Lifestyle  Do not use any products that contain nicotine or tobacco. These products include cigarettes, chewing tobacco, and vaping devices, such as e-cigarettes. If you need help quitting, ask your health care provider.  Eat foods that are high in calcium and vitamin D, such as milk, cheese, and salmon. Doing this may help with any loss in bone density caused by the contraceptive injection. Ask your  health care provider for dietary recommendations. Contact a health care provider if you:  Have nausea or vomiting.  Have abnormal vaginal discharge or bleeding.  Miss a menstrual period or think you might be pregnant.  Experience mood changes or depression.  Feel dizzy or light-headed.  Have leg pain. Get help right away if you:  Have chest pain or  cough up blood.  Have shortness of breath.  Have a severe headache that does not go away.  Have numbness in any part of your body.  Have slurred speech or vision problems.  Have vaginal bleeding that is abnormally heavy or does not stop, or you have severe pain in your abdomen.  Have depression that does not get better with treatment. If you ever feel like you may hurt yourself or others, or have thoughts about taking your own life, get help right away. Go to your nearest emergency department or:  Call your local emergency services (911 in the [place]  Call a suicide crisis helpline, such as the National Suicide Prevention Lifeline at 1-800-273-8255. This is open 24 hours a day in the [place]  Text the Crisis Text Line at 741741 (in the [place] Summary  A contraceptive injection is a shot that prevents pregnancy. It is also called the birth control shot.  The shot is given under the skin (subcutaneous) or into a muscle (intramuscular).  After this procedure, it is common to have soreness around the injection site for a couple of days.  To prevent pregnancy, the shot must be given by a health care provider every 3 months (12-13 weeks).  After you have the shot, ask your health care provider whether you need to use an added method of birth control (backup contraception), such as a condom, sponge, or spermicide. This information is not intended to replace advice given to you by your health care provider. Make sure you discuss any questions you have with your health care provider. Document Revised: 11/17/2019 Document Reviewed: 11/17/2019 Elsevier Patient Education  2021 Elsevier Inc.  

## 2020-07-29 NOTE — Progress Notes (Signed)
GYNECOLOGY CLINIC PROCEDURE NOTE  Ms. Ashley Hawkins is a 20 y.o. G0P0000 here for pain and vomiting with cycles. Patient reports that since Nexplanon placement 2 years ago she has been having worsening cycles that are heavier and more painful. Patient reports that her cycles occur every 3 months. Patient reports making a decision that she wants Nexplanon removal and to go back to Depo.  Nexplanon Removal Patient was given informed consent for removal of her Nexplanon.  Appropriate time out taken. Nexplanon site identified.  Area prepped in usual sterile fashon. One ml of 1% lidocaine was used to anesthetize the area at the distal end of the implant. A small stab incision was made right beside the implant on the distal portion.  The Nexplanon rod was grasped using hemostats and removed without difficulty.  There was minimal blood loss. There were no complications.  A small amount of antibiotic ointment and steri-strips were applied over the small incision.  A pressure bandage was applied to reduce any bruising.  The patient tolerated the procedure well and was given post procedure instructions.  Patient is planning to use Depo for contraception.  Depo initiated today and Rx for continued management sent to pharmacy of choice. Discussed with patient that Depo injection will occur every 3 months. Patient verbalizes understanding as she was on Depo in past.    Sharyon Cable, [place] 07/29/2020 4:54 PM

## 2020-07-29 NOTE — Progress Notes (Signed)
Pain and vomiting with menses cycle that started last week.Patient reports feeling better now.

## 2020-10-19 ENCOUNTER — Ambulatory Visit: Payer: 59

## 2020-10-26 ENCOUNTER — Other Ambulatory Visit: Payer: Self-pay

## 2020-10-26 ENCOUNTER — Ambulatory Visit (INDEPENDENT_AMBULATORY_CARE_PROVIDER_SITE_OTHER): Payer: 59

## 2020-10-26 DIAGNOSIS — Z3042 Encounter for surveillance of injectable contraceptive: Secondary | ICD-10-CM | POA: Diagnosis not present

## 2020-10-26 MED ORDER — MEDROXYPROGESTERONE ACETATE 150 MG/ML IM SUSP
150.0000 mg | INTRAMUSCULAR | Status: DC
  Administered 2020-10-26 – 2021-01-14 (×2): 150 mg via INTRAMUSCULAR

## 2020-10-26 MED ORDER — MEDROXYPROGESTERONE ACETATE 150 MG/ML IM SUSP: 150.0000 mg | INTRAMUSCULAR | 4 refills | Status: DC

## 2020-10-26 NOTE — Progress Notes (Addendum)
Pt present for Depo injection.  Last Depo Given: 07/29/20   Next due:01/11/21-01/25/21   Given w/o any problems in LD  Office supply used pt stated Refill was never sent RF's sent today pt advised to always call if any issues w/getting Rx. Pt voiced understanding. Pt advised to schedule next appt.   Patient was assessed and managed by nursing staff during this encounter. I have reviewed the chart and agree with the documentation and plan. I have also made any necessary editorial changes.  Coral Ceo, MD 10/26/2020 4:03 PM

## 2021-01-14 ENCOUNTER — Other Ambulatory Visit: Payer: Self-pay

## 2021-01-14 ENCOUNTER — Ambulatory Visit (INDEPENDENT_AMBULATORY_CARE_PROVIDER_SITE_OTHER): Payer: 59

## 2021-01-14 DIAGNOSIS — Z3042 Encounter for surveillance of injectable contraceptive: Secondary | ICD-10-CM

## 2021-01-14 NOTE — Progress Notes (Signed)
Subjective:  Pt in for Depo Provera injection.    Objective: Need for contraception. No unusual complaints.    Assessment: Used office supply. Depo given R upper outer quadrant.   Plan:  Next injection due Nov 11-25.  Administrations This Visit     medroxyPROGESTERone (DEPO-PROVERA) injection 150 mg     Admin Date 01/14/2021 Action Given Dose 150 mg Route Intramuscular Administered By Lewayne Bunting, CMA

## 2021-01-14 NOTE — Progress Notes (Signed)
Patient was assessed and managed by nursing staff during this encounter. I have reviewed the chart and agree with the documentation and plan. I have also made any necessary editorial changes.  Catalina Antigua, MD 01/14/2021 11:55 AM

## 2021-04-01 ENCOUNTER — Ambulatory Visit (INDEPENDENT_AMBULATORY_CARE_PROVIDER_SITE_OTHER): Payer: 59

## 2021-04-01 ENCOUNTER — Other Ambulatory Visit: Payer: Self-pay

## 2021-04-01 VITALS — BP 120/77 | HR 88 | Ht 65.0 in | Wt 153.1 lb

## 2021-04-01 DIAGNOSIS — Z3042 Encounter for surveillance of injectable contraceptive: Secondary | ICD-10-CM

## 2021-04-01 MED ORDER — MEDROXYPROGESTERONE ACETATE 150 MG/ML IM SUSP: 150.0000 mg | INTRAMUSCULAR | 3 refills | Status: DC

## 2021-04-01 MED ORDER — MEDROXYPROGESTERONE ACETATE 150 MG/ML IM SUSP
150.0000 mg | Freq: Once | INTRAMUSCULAR | Status: AC
  Administered 2021-04-01: 150 mg via INTRAMUSCULAR

## 2021-04-01 NOTE — Progress Notes (Signed)
Depo Provera 150mg  given IM Left Deltoid. Pt tolerated well. No adverse side effects noted. Used Depo from our stock. Next injection due 06/17/21-07/01/21. Pt to make appointment at checkout.

## 2021-05-12 ENCOUNTER — Encounter (HOSPITAL_BASED_OUTPATIENT_CLINIC_OR_DEPARTMENT_OTHER): Payer: Self-pay | Admitting: Family Medicine

## 2021-05-12 ENCOUNTER — Other Ambulatory Visit: Payer: Self-pay

## 2021-05-12 ENCOUNTER — Ambulatory Visit (INDEPENDENT_AMBULATORY_CARE_PROVIDER_SITE_OTHER): Payer: Self-pay | Admitting: Family Medicine

## 2021-05-12 VITALS — BP 122/82 | HR 89 | Ht 67.0 in | Wt 147.0 lb

## 2021-05-12 DIAGNOSIS — D573 Sickle-cell trait: Secondary | ICD-10-CM

## 2021-05-12 DIAGNOSIS — D509 Iron deficiency anemia, unspecified: Secondary | ICD-10-CM

## 2021-05-12 LAB — CBC WITH DIFFERENTIAL/PLATELET
Basophils Absolute: 0.1 10*3/uL (ref 0.0–0.2)
Basos: 1 %
EOS (ABSOLUTE): 0.2 10*3/uL (ref 0.0–0.4)
Eos: 2 %
Hematocrit: 37.6 % (ref 34.0–46.6)
Hemoglobin: 12.4 g/dL (ref 11.1–15.9)
Immature Grans (Abs): 0 10*3/uL (ref 0.0–0.1)
Immature Granulocytes: 0 %
Lymphocytes Absolute: 2.9 10*3/uL (ref 0.7–3.1)
Lymphs: 27 %
MCH: 27 pg (ref 26.6–33.0)
MCHC: 33 g/dL (ref 31.5–35.7)
MCV: 82 fL (ref 79–97)
Monocytes Absolute: 0.8 10*3/uL (ref 0.1–0.9)
Monocytes: 7 %
NRBC: 1 % — ABNORMAL HIGH (ref 0–0)
Neutrophils Absolute: 6.6 10*3/uL (ref 1.4–7.0)
Neutrophils: 63 %
Platelets: 180 10*3/uL (ref 150–450)
RBC: 4.59 x10E6/uL (ref 3.77–5.28)
RDW: 15.8 % — ABNORMAL HIGH (ref 11.7–15.4)
WBC: 10.6 10*3/uL (ref 3.4–10.8)

## 2021-05-12 NOTE — Progress Notes (Signed)
New Patient Office Visit  Subjective:  Patient ID: Ashley Hawkins, female    DOB: 29-Nov-2000  Age: 20 y.o. 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 Patient states she was at work last week and due to the cold of the warehouse she started to have lower back pains which lead her to believe she was in a sickle cell crisis/attack.    HPI Ashley Hawkins is a 20 year old female presenting to establish in clinic.  She has current concerns as outlined above.  Reports past medical history of sickle cell trait.  Also with relatively recent history of microcytic anemia.  Sickle cell trait: does not recall when screened and diagnosed with this. Reports having some low back pain, then "went up to my spleen".  She presently denies any low back pain.  Is requesting paperwork be completed for accommodations at work related to intermittent pain as with her low back which she feels is related to her sickle cell trait.  Microcytic anemia: Noted on prior labs a little bit over 1 year ago.  Was recommended to have repeat laboratory analysis, however did not complete. 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 Originally from [place]. Outside of work she enjoys drawing, going for walks, hanging out with her friends.  Past Medical History:  Diagnosis Date   Sickle cell anemia (HCC)    Sickle cell trait (HCC) 05/06/2020    History reviewed. No pertinent surgical history.  Family History  Problem Relation Age of Onset   Hypertension Maternal Uncle    Hypertension Maternal  Grandmother     Social History   Socioeconomic History   Marital status: Single    Spouse name: Not on file   Number of children: Not on file   Years of education: Not on file   Highest education level: Not on file  Occupational History   Not on file  Tobacco Use   Smoking status: Never    Passive exposure: Yes   Smokeless tobacco: Never  Substance and Sexual Activity   Alcohol use: No    Alcohol/week: 0.0 standard drinks   Drug use: No   Sexual activity: Not Currently    Partners: Male    Birth control/protection: Condom, Injection    Comment: patient has been active 1 time  Other Topics Concern   Not on file  Social History Narrative   Not on file   Social Determinants of Health   Financial Resource Strain: Not on file  Food Insecurity: Not on file  Transportation Needs: Not on file  Physical Activity: Not on file  Stress: Not on file  Social Connections: Not on file  Intimate Partner Violence: Not on file    Objective:   Today's Vitals: BP 122/82    Pulse 89    Ht 5\' 7"  (1.702 m)    Wt 147 lb (66.7 kg)    LMP  (Approximate) Comment: LMP approx 2021   SpO2 98%    BMI 23.02 kg/m   Physical Exam  20 year old female in no  acute distress Cardiovascular exam with regular rate and rhythm, no murmurs appreciated Lungs clear to auscultation bilaterally  Assessment & Plan:   Problem List Items Addressed This Visit       Other   Sickle cell trait (HCC)    Reports diagnosis of sickle cell trait, unsure of when this testing was completed, possibly related to newborn screening Discussed that typically sickle cell trait does not correlate to presence of symptoms/sickle cell crises Given diagnosis and uncertainty surrounding it, will refer to hematology for further evaluation/recommendations Will hold off on completing any specific paperwork for patient's place of employment until evaluated with hematology      Relevant Orders   CBC with Differential/Platelet    Ambulatory referral to Hematology / Oncology   Microcytic anemia - Primary    Noted about 1 year ago.  Was recommended to have repeat laboratory analysis to monitor status, these labs were not completed Given lack of interval follow-up, recommend completing labs today in order to assess status If continuing to have microcytic anemia, likely proceed with further evaluation to assess for possible iron deficiency      Relevant Orders   CBC with Differential/Platelet   Ambulatory referral to Hematology / Oncology    Outpatient Encounter Medications as of 05/12/2021  Medication Sig   medroxyPROGESTERone (DEPO-PROVERA) 150 MG/ML injection Inject 1 mL (150 mg total) into the muscle every 3 (three) months.   [DISCONTINUED] ferrous sulfate 325 (65 FE) MG tablet Take 1 tablet (325 mg total) by mouth daily with breakfast. (Patient not taking: Reported on 05/06/2020)   No facility-administered encounter medications on file as of 05/12/2021.    Follow-up: Return in about 6 months (around 11/10/2021) for Follow Up.  Plan to complete CPE at that time  Xitlali Kastens J De Peru, MD

## 2021-05-12 NOTE — Assessment & Plan Note (Signed)
Reports diagnosis of sickle cell trait, unsure of when this testing was completed, possibly related to newborn screening Discussed that typically sickle cell trait does not correlate to presence of symptoms/sickle cell crises Given diagnosis and uncertainty surrounding it, will refer to hematology for further evaluation/recommendations Will hold off on completing any specific paperwork for patient's place of employment until evaluated with hematology

## 2021-05-12 NOTE — Assessment & Plan Note (Signed)
Noted about 1 year ago.  Was recommended to have repeat laboratory analysis to monitor status, these labs were not completed Given lack of interval follow-up, recommend completing labs today in order to assess status If continuing to have microcytic anemia, likely proceed with further evaluation to assess for possible iron deficiency

## 2021-05-12 NOTE — Patient Instructions (Signed)
°  Medication Instructions:  Your physician recommends that you continue on your current medications as directed. Please refer to the Current Medication list given to you today. --If you need a refill on any your medications before your next appointment, please call your pharmacy first. If no refills are authorized on file call the office.-- Lab Work: Your physician has recommended that you have lab work today: CBC If you have labs (blood work) drawn today and your tests are completely normal, you will receive your results via MyChart message OR a phone call from our staff.  Please ensure you check your voicemail in the event that you authorized detailed messages to be left on a delegated number. If you have any lab test that is abnormal or we need to change your treatment, we will call you to review the results.  Referrals/Procedures/Imaging: A referral has been placed for you to Hematology for evaluation and treatment. Someone from the scheduling department will be in contact with you in regards to coordinating your consultation. If you do not hear from any of the schedulers within 7-10 business days please give their office a call.  Follow-Up: Your next appointment:   Your physician recommends that you schedule a follow-up appointment in: 6 MONTHS with Dr. de Peru  You will receive a text message or e-mail with a link to a survey about your care and experience with [place] today! We would greatly appreciate your feedback!   Thanks for letting us be apart of your health journey!!  Primary Care and Sports Medicine   Dr. Ceasar Mons Peru   We encourage you to activate your patient portal called "MyChart".  Sign up information is provided on this After Visit Summary.  MyChart is used to connect with patients for Virtual Visits (Telemedicine).  Patients are able to view lab/test results, encounter notes, upcoming appointments, etc.  Non-urgent messages can be sent to your provider as well. To learn more  about what you can do with MyChart, please visit --  [email or web address].

## 2021-05-24 ENCOUNTER — Emergency Department (HOSPITAL_COMMUNITY)
Admission: EM | Admit: 2021-05-24 | Discharge: 2021-05-25 | Disposition: A | Discharge status: Home / Self Care | Payer: Self-pay | Attending: Emergency Medicine | Admitting: Emergency Medicine

## 2021-05-24 ENCOUNTER — Encounter (HOSPITAL_COMMUNITY): Payer: Self-pay

## 2021-05-24 ENCOUNTER — Other Ambulatory Visit: Payer: Self-pay

## 2021-05-24 DIAGNOSIS — J020 Streptococcal pharyngitis: Secondary | ICD-10-CM | POA: Insufficient documentation

## 2021-05-24 LAB — GROUP A STREP BY PCR: Group A Strep by PCR: DETECTED — AB

## 2021-05-24 NOTE — ED Triage Notes (Signed)
Pt reports with sore throat since yesterday. Pt states that she has white patches on her throat.

## 2021-05-25 MED ORDER — LIDOCAINE VISCOUS HCL 2 % MT SOLN: 15.0000 mL | OROMUCOSAL | 0 refills | Status: DC | PRN

## 2021-05-25 MED ORDER — PENICILLIN V POTASSIUM 500 MG PO TABS: 500.0000 mg | ORAL_TABLET | Freq: Two times a day (BID) | ORAL | 0 refills | Status: AC

## 2021-05-25 NOTE — Discharge Instructions (Addendum)
You came to the emergency department today to be evaluated for your sore throat.  Your physical exam and testing was consistent with strep throat.  Due to this you were started on the antibiotic hetacillin.  Please take this medication as prescribed.  I have also given you a prescription for viscous lidocaine, you may take this every 4 hours as needed to help with your sore throat.  You may have diarrhea from the antibiotics.  It is very important that you continue to take the antibiotics even if you get diarrhea unless a medical professional tells you that you may stop taking them.  If you stop too early the bacteria you are being treated for will become stronger and you may need different, more powerful antibiotics that have more side effects and worsening diarrhea.  Please stay well hydrated and consider probiotics as they may decrease the severity of your diarrhea.  Please be aware that if you take any hormonal contraception (birth control pills, nexplanon, the ring, etc) that your birth control will not work while you are taking antibiotics and you need to use back up protection as directed on the birth control medication information insert.   Get help right away if: You have new symptoms, such as vomiting, severe headache, stiff or painful neck, chest pain, or shortness of breath. You have severe throat pain, drooling, or changes in your voice. You have swelling of the neck, or the skin on the neck becomes red and tender. You have signs of dehydration, such as tiredness (fatigue), dry mouth, and decreased urination. You become increasingly sleepy, or you cannot wake up completely. Your joints become red or painful.

## 2021-05-25 NOTE — ED Provider Notes (Signed)
[facility]-EMERGENCY DEPT Provider Note   CSN: [id number] Arrival date & time: 05/24/21  1859     History  Chief Complaint  Patient presents with   Sore Throat    Ashley Hawkins is a 21 y.o. female with no reported past medical history.  Presents to the emergency department with a chief complaint of sore throat.  Patient states that sore throat started yesterday.  Patient denies any trouble swallowing, drooling, trismus, neck stiffness, neck pain, high potato voice, fever, chills, rhinorrhea, nasal congestion, cough, facial swelling.   Sore Throat This is a new problem. The current episode started yesterday. The problem occurs constantly. The problem has not changed since onset.Pertinent negatives include no chest pain, no abdominal pain, no headaches and no shortness of breath. The symptoms are aggravated by swallowing. She has tried nothing for the symptoms.      Home Medications Prior to Admission medications   Medication Sig Start Date End Date Taking? Authorizing Provider  medroxyPROGESTERone (DEPO-PROVERA) 150 MG/ML injection Inject 1 mL (150 mg total) into the muscle every 3 (three) months. 04/01/21   Warden Fillers, MD      Allergies    Codeine    Review of Systems   Review of Systems  Constitutional:  Negative for chills and fever.  HENT:  Positive for sore throat. Negative for congestion, drooling, facial swelling, rhinorrhea, trouble swallowing and voice change.   Eyes:  Negative for visual disturbance.  Respiratory:  Negative for cough and shortness of breath.   Cardiovascular:  Negative for chest pain.  Gastrointestinal:  Negative for abdominal pain, nausea and vomiting.  Musculoskeletal:  Negative for back pain, neck pain and neck stiffness.  Skin:  Negative for color change and rash.  Neurological:  Negative for dizziness, syncope, light-headedness and headaches.  Psychiatric/Behavioral:  Negative for confusion.    Physical  Exam Updated Vital Signs BP 115/71 (BP Location: Left Arm)    Pulse 76    Temp 99.8 F (37.7 C) (Oral)    Resp 12    Ht 5\' 5"  (1.651 m)    Wt 64.4 kg    SpO2 100%    BMI 23.63 kg/m  Physical Exam Vitals and nursing note reviewed.  Constitutional:      General: She is not in acute distress.    Appearance: She is not ill-appearing, toxic-appearing or diaphoretic.  HENT:     Head: Normocephalic.     Jaw: No trismus, swelling, pain on movement or malocclusion.     Comments: No facial swelling    Mouth/Throat:     Lips: Pink. No lesions.     Mouth: Mucous membranes are moist. No oral lesions.     Tongue: No lesions. Tongue does not deviate from midline.     Palate: No mass and lesions.     Pharynx: Oropharynx is clear. Uvula midline. Posterior oropharyngeal erythema present. No pharyngeal swelling, oropharyngeal exudate or uvula swelling.     Tonsils: Tonsillar exudate present. No tonsillar abscesses. 2+ on the right. 2+ on the left.     Comments: Handles oral secretions without difficulty.  Swelling, erythema, and exudate noted to bilateral tonsils. Eyes:     General: No scleral icterus.       Right eye: No discharge.        Left eye: No discharge.  Neck:     Comments: No swelling to submandibular space Cardiovascular:     Rate and Rhythm: Normal rate.  Pulmonary:  Effort: Pulmonary effort is normal. No tachypnea or bradypnea.     Breath sounds: Normal breath sounds. No stridor.  Musculoskeletal:     Cervical back: Normal range of motion and neck supple. No edema, erythema, signs of trauma, rigidity, torticollis or crepitus. No pain with movement, spinous process tenderness or muscular tenderness. Normal range of motion.  Lymphadenopathy:     Cervical: No cervical adenopathy.  Skin:    General: Skin is warm and dry.  Neurological:     General: No focal deficit present.     Mental Status: She is alert.  Psychiatric:        Behavior: Behavior is cooperative.    ED Results /  Procedures / Treatments   Labs (all labs ordered are listed, but only abnormal results are displayed) Labs Reviewed  GROUP A STREP BY PCR - Abnormal; Notable for the following components:      Result Value   Group A Strep by PCR DETECTED (*)    All other components within normal limits    EKG None  Radiology No results found.  Procedures Procedures    Medications Ordered in ED Medications - No data to display  ED Course/ Medical Decision Making/ A&P                           Medical Decision Making  Alert 21 year old female no acute stress, nontoxic-appearing.  Presents to ED with chief complaint of sore throat.  Symptoms x2 days.  Patient denies any trismus, trouble swallowing, trouble breathing, drooling, high potato voice, neck pain, neck stiffness.  On physical exam no swelling to submandibular space or signs of peritonsillar abscess.  Suspicion for Ludewig's angina, peritonsillar abscess, or deep space neck infection at this time.  Patient does have swelling and exudate to bilateral tonsils.  Group A strep PCR positive.  Patient was offered IM penicillin in the emergency department however declines at this time.  Will prescribe patient with 10-day course of penicillin.  Additionally will give patient viscous lidocaine to help with her sore throat.  Discussed further symptomatic treatment with patient.  Patient to follow-up with PCP if symptoms do not improve after antibiotic use.  Discussed results, findings, treatment and follow up. Patient advised of return precautions. Patient verbalized understanding and agreed with plan.         Final Clinical Impression(s) / ED Diagnoses Final diagnoses:  Strep pharyngitis    Rx / DC Orders ED Discharge Orders          Ordered    lidocaine (XYLOCAINE) 2 % solution  Every 4 hours PRN        05/25/21 0117    penicillin v potassium (VEETID) 500 MG tablet  2 times daily        05/25/21 0118              Loni Beckwith, PA-C 05/25/21 0124    Merryl Hacker, MD 05/26/21 [id number]

## 2021-06-21 ENCOUNTER — Ambulatory Visit (INDEPENDENT_AMBULATORY_CARE_PROVIDER_SITE_OTHER): Payer: Self-pay | Admitting: *Deleted

## 2021-06-21 ENCOUNTER — Other Ambulatory Visit: Payer: Self-pay

## 2021-06-21 DIAGNOSIS — Z3042 Encounter for surveillance of injectable contraceptive: Secondary | ICD-10-CM

## 2021-06-21 MED ORDER — MEDROXYPROGESTERONE ACETATE 150 MG/ML IM SUSP
150.0000 mg | Freq: Once | INTRAMUSCULAR | Status: AC
  Administered 2021-06-21: 150 mg via INTRAMUSCULAR

## 2021-06-21 NOTE — Progress Notes (Signed)
Ashley Hawkins presents today for Depo Injection.  OBJECTIVE: Appears well, in no apparent distress.  OB History     Gravida  0   Para  0   Term  0   Preterm  0   AB  0   Living  0      SAB  0   IAB  0   Ectopic  0   Multiple  0   Live Births              I have reviewed the patient's allergies, and medications.   ASSESSMENT: Office stock Depo injection given  PLAN Follow up for next depo 4/18-09/20/21 Pt made aware to bring Depo with her to visits.   Administrations This Visit     medroxyPROGESTERone (DEPO-PROVERA) injection 150 mg     Admin Date 06/21/2021 Action Given Dose 150 mg Route Intramuscular Administered By Valene Bors, CMA

## 2021-07-08 ENCOUNTER — Encounter (HOSPITAL_BASED_OUTPATIENT_CLINIC_OR_DEPARTMENT_OTHER): Payer: Self-pay | Admitting: Family Medicine

## 2021-09-13 ENCOUNTER — Ambulatory Visit: Payer: Self-pay

## 2021-09-28 ENCOUNTER — Ambulatory Visit (INDEPENDENT_AMBULATORY_CARE_PROVIDER_SITE_OTHER): Payer: Self-pay

## 2021-09-28 DIAGNOSIS — Z3042 Encounter for surveillance of injectable contraceptive: Secondary | ICD-10-CM

## 2021-09-28 MED ORDER — MEDROXYPROGESTERONE ACETATE 150 MG/ML IM SUSP
150.0000 mg | Freq: Once | INTRAMUSCULAR | Status: AC
  Administered 2021-09-28: 150 mg via INTRAMUSCULAR

## 2021-09-28 MED ORDER — MEDROXYPROGESTERONE ACETATE 150 MG/ML IM SUSP: 150.0000 mg | INTRAMUSCULAR | 3 refills | Status: DC

## 2021-09-28 NOTE — Progress Notes (Signed)
SUBJECTIVE: Ashley Hawkins is a 21 y.o. female who presents for DEPO Injection ? ?OBJECTIVE: Appears well, in no apparent distress.  Vital signs are normal.  ? ?ASSESSMENT: Need for BC. Needs 1st PAP. ? ?PLAN: Office stock DEPO Injection given in RD, tolerated well. ?Next DEPO due July 26 - December 28, 2021 ? ?Administrations This Visit   ? ? medroxyPROGESTERone (DEPO-PROVERA) injection 150 mg   ? ? Admin Date ?09/28/2021 Action ?Given Dose ?150 mg Route ?Intramuscular Administered By ?Maretta Bees, RMA  ? ?  ?  ? ?  ?  ?

## 2021-11-10 ENCOUNTER — Ambulatory Visit (HOSPITAL_BASED_OUTPATIENT_CLINIC_OR_DEPARTMENT_OTHER): Payer: Self-pay | Admitting: Family Medicine

## 2021-12-13 ENCOUNTER — Encounter (HOSPITAL_BASED_OUTPATIENT_CLINIC_OR_DEPARTMENT_OTHER): Payer: Self-pay | Admitting: Family Medicine

## 2021-12-14 ENCOUNTER — Other Ambulatory Visit (HOSPITAL_COMMUNITY)
Admission: RE | Admit: 2021-12-14 | Discharge: 2021-12-14 | Disposition: A | Discharge status: Home / Self Care | Payer: Medicaid Other | Source: Ambulatory Visit | Attending: Student | Admitting: Student

## 2021-12-14 ENCOUNTER — Encounter: Payer: Self-pay | Admitting: Student

## 2021-12-14 ENCOUNTER — Ambulatory Visit (INDEPENDENT_AMBULATORY_CARE_PROVIDER_SITE_OTHER): Payer: Medicaid Other | Admitting: Student

## 2021-12-14 VITALS — BP 118/77 | HR 87 | Ht 64.0 in | Wt 159.0 lb

## 2021-12-14 DIAGNOSIS — Z01419 Encounter for gynecological examination (general) (routine) without abnormal findings: Secondary | ICD-10-CM | POA: Insufficient documentation

## 2021-12-14 DIAGNOSIS — Z3042 Encounter for surveillance of injectable contraceptive: Secondary | ICD-10-CM

## 2021-12-14 MED ORDER — MEDROXYPROGESTERONE ACETATE 150 MG/ML IM SUSP
150.0000 mg | INTRAMUSCULAR | Status: DC
  Administered 2021-12-14: 150 mg via INTRAMUSCULAR

## 2021-12-14 MED ORDER — MEDROXYPROGESTERONE ACETATE 150 MG/ML IM SUSP: 150.0000 mg | INTRAMUSCULAR | 3 refills | Status: DC

## 2021-12-14 NOTE — Progress Notes (Signed)
Pt is in the office for annual. Never had a pap  Denies any abnormal symptoms today. Currently on [facility] for [facility] .Marland KitchenAdministered depo in R del and pt tolerated well. Next due Oct 11-25. Administrations This Visit     medroxyPROGESTERone (DEPO-PROVERA) injection 150 mg     Admin Date 12/14/2021 Action Given Dose 150 mg Route Intramuscular Administered By Katrina Stack, RN

## 2021-12-14 NOTE — Progress Notes (Signed)
  History:  Ms. Ashley Hawkins is a 21 y.o. G0P0000 who presents to clinic today for well women's exam. Patient has no concerns or complaints. Patient is nervouse for first women's wellness exam. Patient is happy with current use of Depo-Provera.   The following portions of the patient's history were reviewed and updated as appropriate: allergies, current medications, family history, past medical history, social history, past surgical history and problem list.  Review of Systems:  Review of Systems  Constitutional:  Negative for chills, fever and weight loss.  Respiratory:  Negative for cough and shortness of breath.   Cardiovascular:  Negative for chest pain and palpitations.  Gastrointestinal:  Negative for abdominal pain, nausea and vomiting.  Genitourinary:  Negative for dysuria, frequency and urgency.  Neurological:  Negative for weakness and headaches.  Psychiatric/Behavioral:  Negative for depression. The patient is nervous/anxious.       Objective:  Physical Exam  BP 118/77   Pulse 87   Ht 5\' 4"  (1.626 m)   Wt 159 lb (72.1 kg)   BMI 27.29 kg/m  CONSTITUTIONAL: Well-developed, well-nourished female in no acute distress.  NEUROLOGIC: Alert and oriented to person, place, and time. Normal reflexes, muscle tone coordination.  PSYCHIATRIC: Normal mood and affect. Normal behavior. Normal judgment and thought content. CARDIOVASCULAR: Normal heart rate noted, regular rhythm RESPIRATORY: Effort and breath sounds normal, no problems with respiration noted. BREASTS: Symmetric in size. No masses, tenderness, skin changes, nipple drainage, or lymphadenopathy bilaterally. ABDOMEN: Soft, no distention noted.  No tenderness or guarding.  PELVIC: Normal appearing external genitalia and urethral meatus; normal appearing vaginal mucosa and cervix.  No abnormal discharge noted.  Pap smear obtained.  Normal uterine size, no other palpable masses, no uterine or adnexal tenderness.   Labs and  Imaging No results found for this or any previous visit (from the past 24 hour(s)).  No results found.  Health Maintenance Due  Topic Date Due   COVID-19 Vaccine (1) Never done   HPV VACCINES (1 - 2-dose series) Never done   Hepatitis C Screening  Never done   TETANUS/TDAP  Never done   CHLAMYDIA SCREENING  04/21/2020   PAP-Cervical Cytology Screening  Never done   PAP SMEAR-Modifier  Never done    Labs, imaging and previous visits in Epic and Care Everywhere reviewed  Assessment & Plan:  1. Women's annual routine gynecological examination - Discussed the indications for PAP smears and management of screening moving forward. Will plan for repeat in 3 years unless otherwise clinically indicated. - Discussed and offered HPV (Gardasil) vaccines and potential protective benefits. Patient declined at this time and would like to think about this further - Cytology - PAP  2. Encounter for management and injection of depo-Provera - Continuing with Depo-Provera injections - medroxyPROGESTERone (DEPO-PROVERA) injection 150 mg - medroxyPROGESTERone (DEPO-PROVERA) 150 MG/ML injection; Inject 1 mL (150 mg total) into the muscle every 3 (three) months.  Dispense: 1 mL; Refill: 3   Approximately 20 minutes of total time was spent with this patient on counseling and coordination of care.  14/05/2019, NP 12/14/2021 12:13 PM

## 2021-12-14 NOTE — Progress Notes (Signed)
Patient was assessed and managed by nursing staff during this encounter. I have reviewed the chart and agree with the documentation and plan. I have also made any necessary editorial changes.  Denaja Verhoeven, MD 12/14/2021 2:34 PM   

## 2021-12-19 LAB — CYTOLOGY - PAP
Chlamydia: POSITIVE — AB
Comment: NEGATIVE
Comment: NEGATIVE
Comment: NORMAL
Diagnosis: UNDETERMINED — AB
High risk HPV: POSITIVE — AB
Neisseria Gonorrhea: NEGATIVE

## 2021-12-22 ENCOUNTER — Other Ambulatory Visit: Payer: Self-pay | Admitting: Student

## 2021-12-22 DIAGNOSIS — R87810 Cervical high risk human papillomavirus (HPV) DNA test positive: Secondary | ICD-10-CM

## 2021-12-22 DIAGNOSIS — A749 Chlamydial infection, unspecified: Secondary | ICD-10-CM

## 2021-12-22 MED ORDER — DOXYCYCLINE HYCLATE 100 MG PO CAPS: 100.0000 mg | ORAL_CAPSULE | Freq: Two times a day (BID) | ORAL | 0 refills | Status: AC

## 2021-12-27 ENCOUNTER — Telehealth: Payer: Self-pay

## 2021-12-27 NOTE — Telephone Encounter (Signed)
S/w pt and she did not read mychart message advising her of abnormal results and +CT. Advised that rx was previously sent to pharmacy, partner testing/treatment and follow up with questions if needed.

## 2022-01-03 ENCOUNTER — Ambulatory Visit (INDEPENDENT_AMBULATORY_CARE_PROVIDER_SITE_OTHER): Payer: Self-pay

## 2022-01-03 VITALS — BP 107/70 | HR 89 | Ht 65.0 in | Wt 161.0 lb

## 2022-01-03 DIAGNOSIS — Z3202 Encounter for pregnancy test, result negative: Secondary | ICD-10-CM

## 2022-01-03 LAB — POCT URINE PREGNANCY: Preg Test, Ur: NEGATIVE

## 2022-01-03 NOTE — Progress Notes (Signed)
Agree with nurses's documentation of this patient's clinic encounter.  Selina Tapper L, MD  

## 2022-01-03 NOTE — Progress Notes (Addendum)
Ms. Alderete presents today for UPT. Condom burst during sex.  She has no unusual complaints.  LMP: Unknown    OBJECTIVE: Appears well, in no apparent distress.  OB History     Gravida  0   Para  0   Term  0   Preterm  0   AB  0   Living  0      SAB  0   IAB  0   Ectopic  0   Multiple  0   Live Births             Home UPT Result: In-Office UPT result: Negative  I have reviewed the patient's medical, obstetrical, social, and family histories, and medications.   ASSESSMENT: Negative pregnancy test  PLAN Make appt for [facility]

## 2022-01-25 ENCOUNTER — Ambulatory Visit: Payer: Medicaid Other

## 2022-03-14 ENCOUNTER — Ambulatory Visit (INDEPENDENT_AMBULATORY_CARE_PROVIDER_SITE_OTHER): Payer: Medicaid Other

## 2022-03-14 DIAGNOSIS — Z3042 Encounter for surveillance of injectable contraceptive: Secondary | ICD-10-CM

## 2022-03-14 MED ORDER — MEDROXYPROGESTERONE ACETATE 150 MG/ML IM SUSP
150.0000 mg | Freq: Once | INTRAMUSCULAR | Status: AC
  Administered 2022-03-14: 150 mg via INTRAMUSCULAR

## 2022-03-14 NOTE — Progress Notes (Signed)
Date last pap: 12/14/21. Last Depo-Provera: 01/03/22. Side Effects if any: NA. Serum HCG indicated? NA. Depo-Provera 150 mg IM given by: Elyn Peers, RN. Inj given in LD. Patient tolerated well Next appointment due 1/9-1/23.

## 2022-03-14 NOTE — Progress Notes (Signed)
Chart reviewed - agree with CMA/RN documentation.  ° °

## 2022-05-24 ENCOUNTER — Telehealth: Payer: Self-pay | Admitting: Emergency Medicine

## 2022-05-24 NOTE — Telephone Encounter (Signed)
Attempted RC to patient who wants to know when her next apt is scheduled. Unable to leave voicemail.

## 2022-06-06 ENCOUNTER — Ambulatory Visit: Payer: Medicaid Other

## 2022-09-28 IMAGING — CT CT NECK W/ CM
3 of 5 series · 14 of 33 positions shown, 16 images · IV contrast (ISOVUE 300)
Comparison: CT neck 04/01/2020

CLINICAL DATA: Follow-up peritonsillar abscess.

EXAM:
CT NECK WITH CONTRAST
TECHNIQUE: Multidetector CT imaging of the neck was performed using the
standard protocol following the bolus administration of intravenous
contrast.
CONTRAST:  75mL OMNIPAQUE IOHEXOL 300 MG/ML  SOLN

[Series 6: orthogonal ax · axial · 0.44mm/px · z∈[+1275,+1447]mm · 6 of 129 slices shown, 8 images]
[im 19/129  soft-tissue]
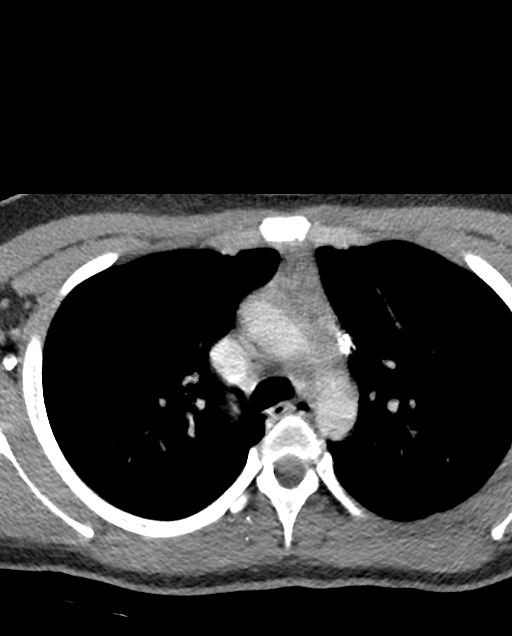
[im 19/129  bone]
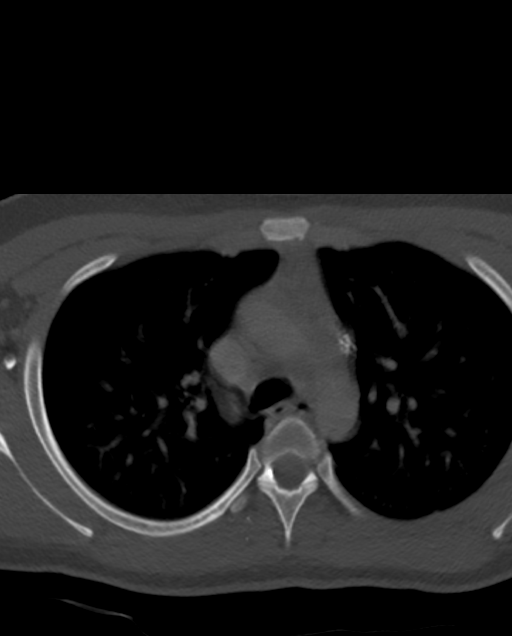
[im 37/129  bone]
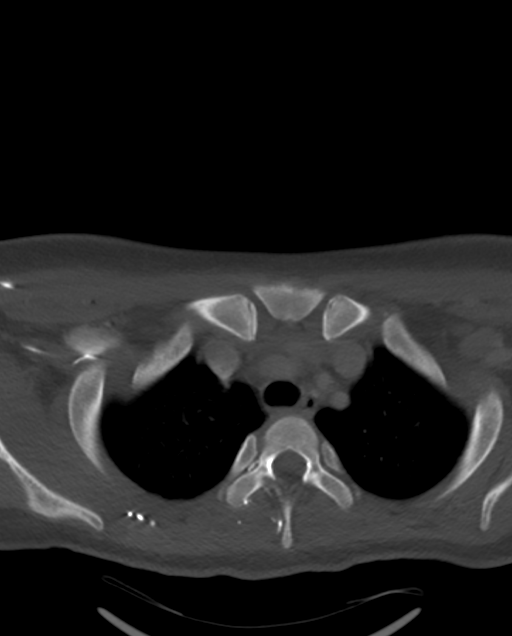
[im 55/129  bone]
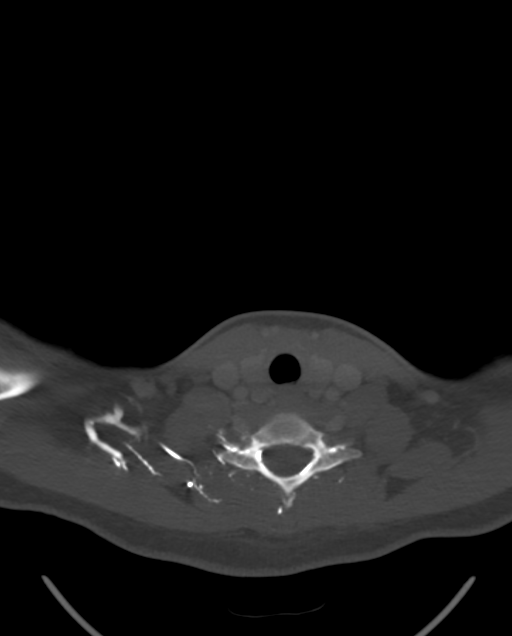
[im 74/129  bone]
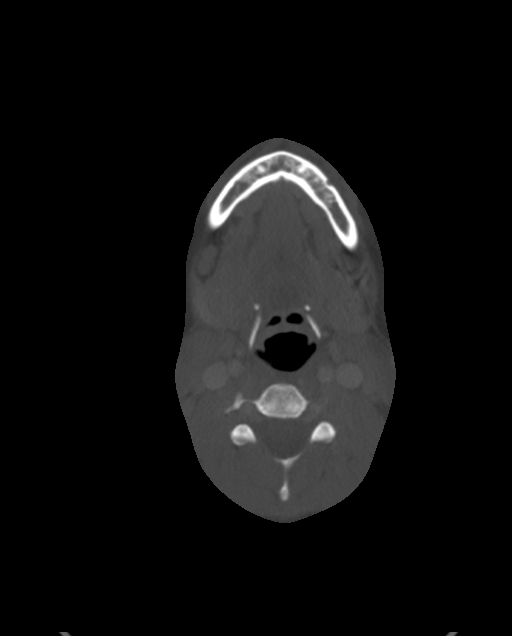
[im 92/129  soft-tissue]
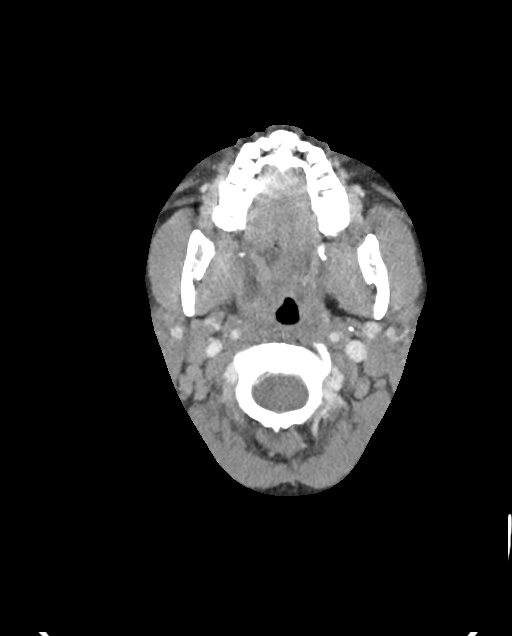
[im 92/129  bone]
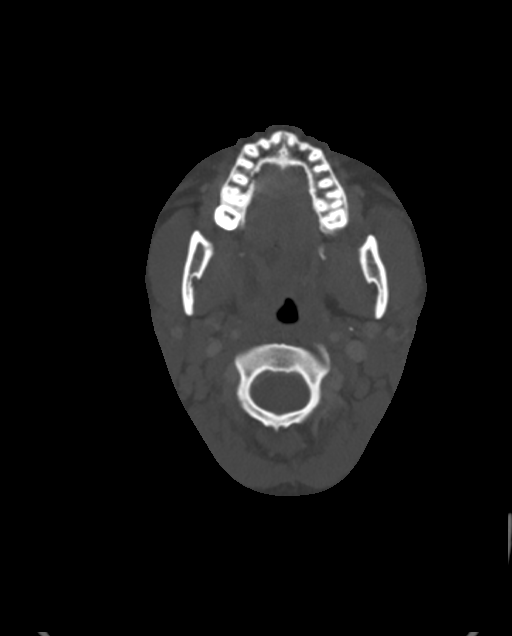
[im 110/129  bone]
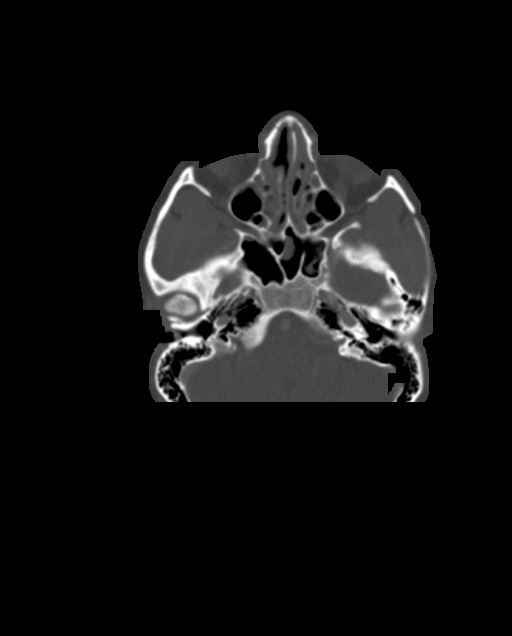

[Series 7: cor neck · coronal · 0.47mm/px · 3 of 137 slices shown]
[im 28/137  bone]
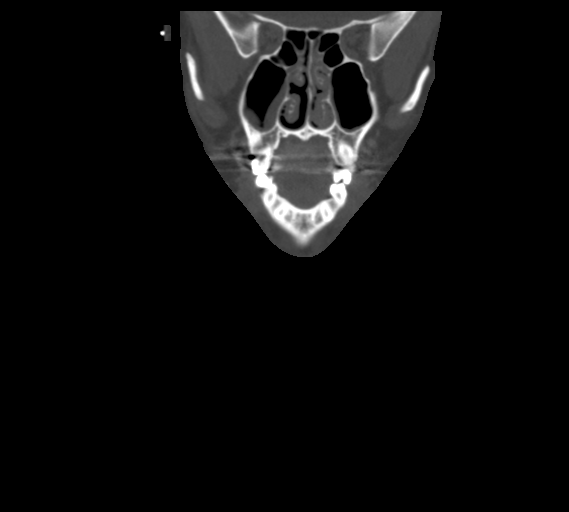
[im 55/137  bone]
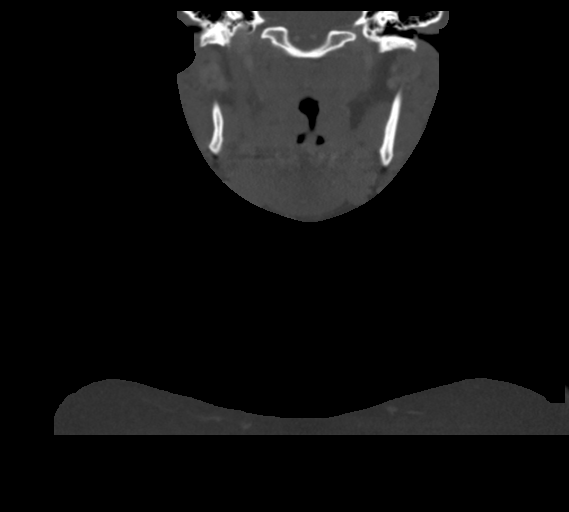
[im 82/137  bone]
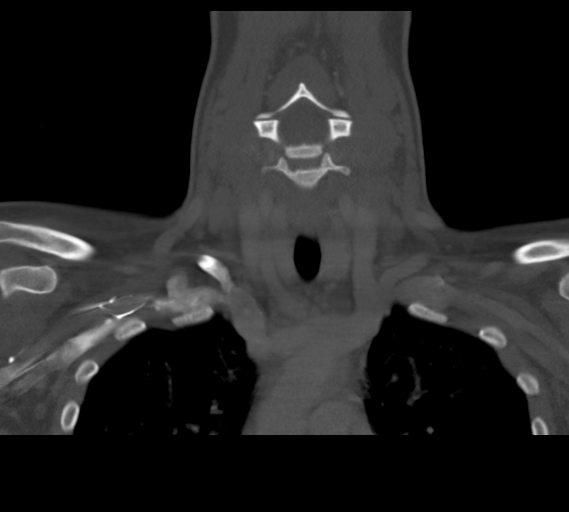

[Series 8: sag neck · sagittal · 0.47mm/px · 5 of 131 slices shown]
[im 22/131  bone]
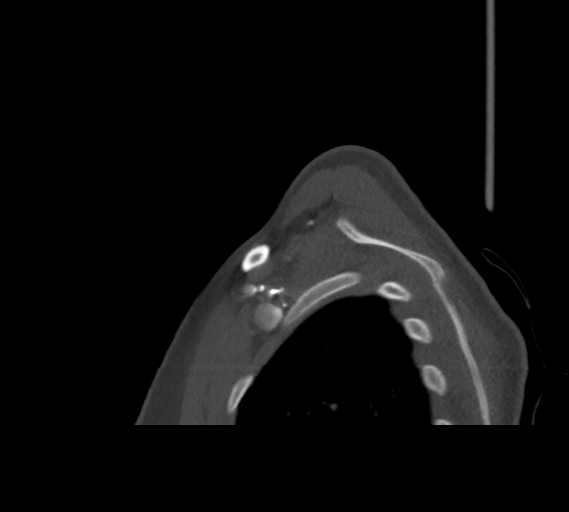
[im 44/131  bone]
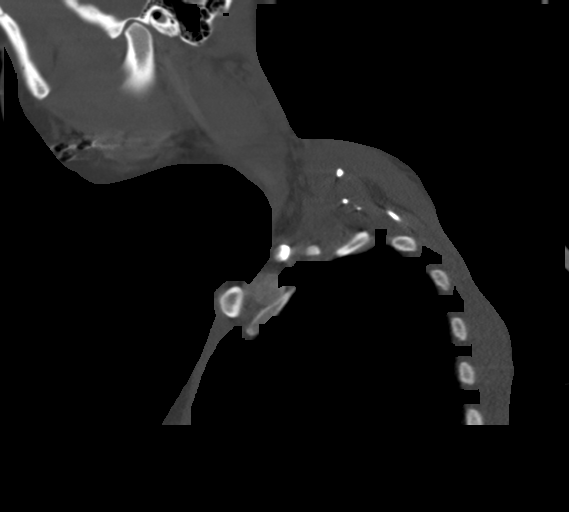
[im 66/131  bone]
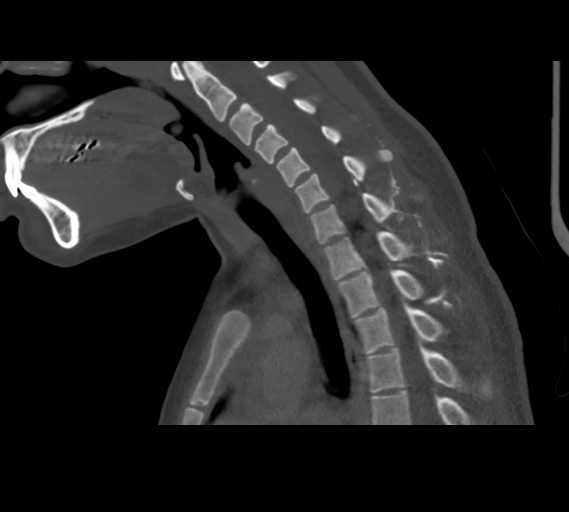
[im 87/131  bone]
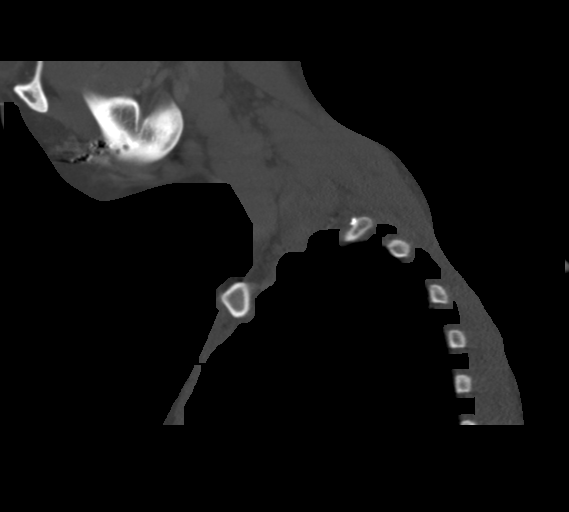
[im 109/131  bone]
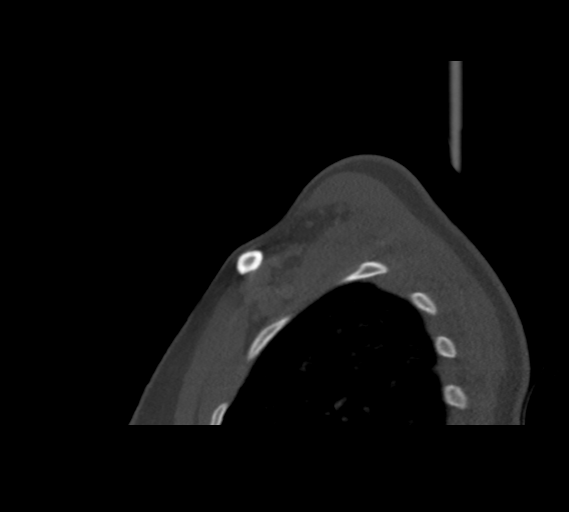

[14 of 33 positions shown; findings below may reference images not displayed]

FINDINGS: Pharynx and larynx: Interval improvement in right peritonsillar
fluid collection. Fluid collection now measures 13 x 21 mm compared
with 28 x 26 mm previously. The fluid collection has a crescentic
shape. No airway compromise. Negative left tonsil. Epiglottis and
larynx normal.

Salivary glands: No inflammation, mass, or stone.

Thyroid: Negative

Lymph nodes: Improvement in bilateral lymph nodes right greater than
left. Right level 2 lymph nodes measure 11 mm and 11 mm.
Subcentimeter posterior lymph nodes on the right. 1 cm left level 2
lymph node.

Vascular: Normal vascular enhancement.

Limited intracranial: Negative

Visualized orbits: Negative

Mastoids and visualized paranasal sinuses: Mucosal edema paranasal
sinuses without air-fluid level. Mastoid clear bilaterally.

Skeleton: No focal skeletal abnormality.

Upper chest: Lung apices clear bilaterally.

Other: None
IMPRESSION: Interval improvement in right peritonsillar abscess now measuring 13
x 21 mm. Decreased surrounding edema. Improving cervical adenopathy.

## 2023-03-09 ENCOUNTER — Encounter: Payer: Self-pay | Admitting: Obstetrics and Gynecology

## 2023-03-09 ENCOUNTER — Ambulatory Visit (INDEPENDENT_AMBULATORY_CARE_PROVIDER_SITE_OTHER): Payer: Medicaid Other

## 2023-03-09 ENCOUNTER — Telehealth: Payer: Self-pay

## 2023-03-09 VITALS — BP 122/82 | HR 78 | Wt 142.1 lb

## 2023-03-09 DIAGNOSIS — Z3201 Encounter for pregnancy test, result positive: Secondary | ICD-10-CM | POA: Diagnosis not present

## 2023-03-09 DIAGNOSIS — Z3200 Encounter for pregnancy test, result unknown: Secondary | ICD-10-CM

## 2023-03-09 LAB — POCT URINE PREGNANCY: Preg Test, Ur: POSITIVE — AB

## 2023-03-09 MED ORDER — PRENATE MINI 18-0.6-0.4-350 MG PO CAPS: 1.0000 | ORAL_CAPSULE | Freq: Every day | ORAL | 11 refills | Status: AC

## 2023-03-09 NOTE — Progress Notes (Signed)
..  Ashley Hawkins presents today for UPT. She has no unusual complaints. LMP: 02/04/23     OBJECTIVE: Appears well, in no apparent distress.  OB History     Gravida  1   Para  0   Term  0   Preterm  0   AB  0   Living  0      SAB  0   IAB  0   Ectopic  0   Multiple  0   Live Births             Home UPT Result: Positive In-Office UPT result: Positive I have reviewed the patient's medical, obstetrical, social, and family histories, and medications.   ASSESSMENT: Positive pregnancy test  PLAN Prenatal care to be completed at: [facility] Provided safe med list PNV sent to pharmacy

## 2023-03-09 NOTE — Telephone Encounter (Signed)
Patient called and left message. Did not leave any details.  Called patient back. No answer. Unable to leave vm. VM box is full.

## 2023-03-16 ENCOUNTER — Encounter (HOSPITAL_COMMUNITY): Payer: Self-pay | Admitting: Obstetrics and Gynecology

## 2023-03-16 ENCOUNTER — Other Ambulatory Visit: Payer: Self-pay

## 2023-03-16 ENCOUNTER — Inpatient Hospital Stay (HOSPITAL_COMMUNITY)
Admission: AD | Admit: 2023-03-16 | Discharge: 2023-03-17 | Disposition: A | Discharge status: Home / Self Care | Payer: Medicaid Other | Attending: Obstetrics and Gynecology | Admitting: Obstetrics and Gynecology

## 2023-03-16 DIAGNOSIS — B9689 Other specified bacterial agents as the cause of diseases classified elsewhere: Secondary | ICD-10-CM

## 2023-03-16 DIAGNOSIS — Z3A01 Less than 8 weeks gestation of pregnancy: Secondary | ICD-10-CM | POA: Diagnosis not present

## 2023-03-16 DIAGNOSIS — O26891 Other specified pregnancy related conditions, first trimester: Secondary | ICD-10-CM | POA: Insufficient documentation

## 2023-03-16 DIAGNOSIS — R109 Unspecified abdominal pain: Secondary | ICD-10-CM | POA: Insufficient documentation

## 2023-03-16 DIAGNOSIS — N83201 Unspecified ovarian cyst, right side: Secondary | ICD-10-CM | POA: Insufficient documentation

## 2023-03-16 DIAGNOSIS — O219 Vomiting of pregnancy, unspecified: Secondary | ICD-10-CM | POA: Insufficient documentation

## 2023-03-16 DIAGNOSIS — Z362 Encounter for other antenatal screening follow-up: Secondary | ICD-10-CM

## 2023-03-16 DIAGNOSIS — O3481 Maternal care for other abnormalities of pelvic organs, first trimester: Secondary | ICD-10-CM | POA: Diagnosis not present

## 2023-03-16 LAB — URINALYSIS, ROUTINE W REFLEX MICROSCOPIC
Bilirubin Urine: NEGATIVE
Glucose, UA: NEGATIVE mg/dL
Hgb urine dipstick: NEGATIVE
Ketones, ur: NEGATIVE mg/dL
Nitrite: NEGATIVE
Protein, ur: NEGATIVE mg/dL
Specific Gravity, Urine: 1.025 (ref 1.005–1.030)
pH: 5 (ref 5.0–8.0)

## 2023-03-16 NOTE — MAU Note (Signed)
.  Ashley Hawkins is a 22 y.o. at [redacted]w[redacted]d here in MAU reporting: N/V - unable to keep anything down. Reports emesis x3 today. Having intermittent sharp lower abdominal pain. Denies VB or abnormal discharge.   Onset of complaint: today  Pain score: 8 Vitals:   03/16/23 2319  BP: 113/68  Pulse: 62  Resp: 16  Temp: 98.3 F (36.8 C)  SpO2: 100%     FHT:NA  Lab orders placed from triage:  UA

## 2023-03-17 ENCOUNTER — Inpatient Hospital Stay (HOSPITAL_COMMUNITY): Payer: Medicaid Other

## 2023-03-17 DIAGNOSIS — N83201 Unspecified ovarian cyst, right side: Secondary | ICD-10-CM | POA: Insufficient documentation

## 2023-03-17 LAB — CBC
HCT: 29.2 % — ABNORMAL LOW (ref 36.0–46.0)
Hemoglobin: 10.5 g/dL — ABNORMAL LOW (ref 12.0–15.0)
MCH: 26.4 pg (ref 26.0–34.0)
MCHC: 36 g/dL (ref 30.0–36.0)
MCV: 73.4 fL — ABNORMAL LOW (ref 80.0–100.0)
Platelets: 166 10*3/uL (ref 150–400)
RBC: 3.98 MIL/uL (ref 3.87–5.11)
RDW: 14.6 % (ref 11.5–15.5)
WBC: 14.3 10*3/uL — ABNORMAL HIGH (ref 4.0–10.5)
nRBC: 0.6 % — ABNORMAL HIGH (ref 0.0–0.2)

## 2023-03-17 LAB — COMPREHENSIVE METABOLIC PANEL
ALT: 16 U/L (ref 0–44)
AST: 17 U/L (ref 15–41)
Albumin: 4.2 g/dL (ref 3.5–5.0)
Alkaline Phosphatase: 57 U/L (ref 38–126)
Anion gap: 9 (ref 5–15)
BUN: 7 mg/dL (ref 6–20)
CO2: 24 mmol/L (ref 22–32)
Calcium: 9.2 mg/dL (ref 8.9–10.3)
Chloride: 103 mmol/L (ref 98–111)
Creatinine, Ser: 0.58 mg/dL (ref 0.44–1.00)
GFR, Estimated: >60 mL/min (ref >60)
Glucose, Bld: 88 mg/dL (ref 70–99)
Potassium: 3.6 mmol/L (ref 3.5–5.1)
Sodium: 136 mmol/L (ref 135–145)
Total Bilirubin: 2.8 mg/dL — ABNORMAL HIGH (ref 0.3–1.2)
Total Protein: 7.9 g/dL (ref 6.5–8.1)

## 2023-03-17 LAB — WET PREP, GENITAL
Sperm: NONE SEEN
Trich, Wet Prep: NONE SEEN
WBC, Wet Prep HPF POC: >=10 — AB (ref <10)
Yeast Wet Prep HPF POC: NONE SEEN

## 2023-03-17 LAB — ABO/RH: ABO/RH(D): O POS

## 2023-03-17 LAB — HCG, QUANTITATIVE, PREGNANCY: hCG, Beta Chain, Quant, S: 21108 m[IU]/mL — ABNORMAL HIGH (ref <5)

## 2023-03-17 MED ORDER — BONJESTA 20-20 MG PO TBCR: 1.0000 | EXTENDED_RELEASE_TABLET | Freq: Every day | ORAL | 2 refills | Status: DC

## 2023-03-17 MED ORDER — METRONIDAZOLE 500 MG PO TABS
500.0000 mg | ORAL_TABLET | Freq: Two times a day (BID) | ORAL | 0 refills | Status: DC
Start: 2023-03-17 — End: 2023-10-21

## 2023-03-17 MED ORDER — METOCLOPRAMIDE HCL 10 MG PO TABS
10.0000 mg | ORAL_TABLET | Freq: Once | ORAL | Status: AC
  Administered 2023-03-17: 10 mg via ORAL
  Filled 2023-03-17: qty 1

## 2023-03-17 NOTE — MAU Provider Note (Signed)
History     CSN: [id number]  Arrival date and time: 03/16/23 2245   None     Chief Complaint  Patient presents with   Abdominal Pain   Emesis   Nausea    Ashley Hawkins is a 22 y.o. G1P0 at [redacted]w[redacted]d by Definite LMP of Sept 15. 2024.  She is scheduled to start care at [facility] on 04/27/2023. She presents today for nausea and vomiting.   She reports she has been drinking ginger ale and eating saltines with no relief. She reports she had vomiting last at 1945. She reports having 3 bouts of vomiting yesterday. Patient also reports abdominal pain that starts in lower abdomin and radiates to her right side.  Patient states the pain is intermittent and is improved with applying pressure, but worsened with movement.  She states the pain lasts ~ 15 minutes.  She rates the pain a 4/10 currently and states it is minimal.   She reports she has been taking [employer] vitamins.   OB History     Gravida  1   Para  0   Term  0   Preterm  0   AB  0   Living  0      SAB  0   IAB  0   Ectopic  0   Multiple  0   Live Births              Past Medical History:  Diagnosis Date   Sickle cell anemia (HCC)    Sickle cell trait (HCC) 05/06/2020    Past Surgical History:  Procedure Laterality Date   APPENDECTOMY  2019    Family History  Problem Relation Age of Onset   Hypertension Maternal Uncle    Hypertension Maternal Grandmother     Social History   Tobacco Use   Smoking status: Never    Passive exposure: Yes   Smokeless tobacco: Never  Substance Use Topics   Alcohol use: No    Alcohol/week: 0.0 standard drinks of alcohol   Drug use: No    Allergies:  Allergies  Allergen Reactions   Codeine Hives    Facility-Administered Medications Prior to Admission  Medication Dose Route Frequency Provider Last Rate Last Admin   medroxyPROGESTERone (DEPO-PROVERA) injection 150 mg  150 mg Intramuscular Q90 days Corlis Hove, NP   150 mg at 12/14/21 1050    Medications Prior to Admission  Medication Sig Dispense Refill Last Dose   lidocaine (XYLOCAINE) 2 % solution Use as directed 15 mLs in the mouth or throat every 4 (four) hours as needed for mouth pain. (Patient not taking: Reported on 12/14/2021) 400 mL 0    medroxyPROGESTERone (DEPO-PROVERA) 150 MG/ML injection Inject 1 mL (150 mg total) into the muscle every 3 (three) months. (Patient not taking: Reported on 03/09/2023) 1 mL 3    Prenat-FeCbn-FeAsp-Meth-FA-DHA (PRENATE MINI) 18-0.6-0.4-350 MG CAPS Take 1 tablet by mouth daily. 30 capsule 11     Review of Systems  Gastrointestinal:  Positive for abdominal pain, nausea and vomiting (None currently).  Genitourinary:  Negative for difficulty urinating, dysuria, vaginal bleeding and vaginal discharge.  Neurological:  Negative for dizziness, light-headedness and headaches.   Physical Exam   Blood pressure 113/68, pulse 62, temperature 98.3 F (36.8 C), temperature source Oral, resp. rate 16, height 5\' 5"  (1.651 m), weight 63.9 kg, last menstrual period 02/04/2023, SpO2 100%.  Physical Exam Vitals and nursing note reviewed.  Constitutional:      Appearance: Normal appearance.  She is well-developed.  HENT:     Head: Normocephalic and atraumatic.  Eyes:     Conjunctiva/sclera: Conjunctivae normal.  Cardiovascular:     Rate and Rhythm: Normal rate.  Pulmonary:     Effort: Pulmonary effort is normal. No respiratory distress.  Musculoskeletal:        General: Normal range of motion.     Cervical back: Normal range of motion.  Skin:    General: Skin is warm and dry.  Neurological:     Mental Status: She is alert and oriented to person, place, and time.  Psychiatric:        Mood and Affect: Mood normal.        Behavior: Behavior normal.    MAU Course  Procedures Results for orders placed or performed during the hospital encounter of 03/16/23 (from the past 24 hour(s))  Urinalysis, Routine w reflex microscopic -Urine, Clean Catch      Status: Abnormal   Collection Time: 03/16/23 11:26 AM  Result Value Ref Range   Color, Urine AMBER (A) YELLOW   APPearance HAZY (A) CLEAR   Specific Gravity, Urine 1.025 1.005 - 1.030   pH 5.0 5.0 - 8.0   Glucose, UA NEGATIVE NEGATIVE mg/dL   Hgb urine dipstick NEGATIVE NEGATIVE   Bilirubin Urine NEGATIVE NEGATIVE   Ketones, ur NEGATIVE NEGATIVE mg/dL   Protein, ur NEGATIVE NEGATIVE mg/dL   Nitrite NEGATIVE NEGATIVE   Leukocytes,Ua TRACE (A) NEGATIVE   RBC / HPF 0-5 0 - 5 RBC/hpf   WBC, UA 0-5 0 - 5 WBC/hpf   Bacteria, UA RARE (A) NONE SEEN   Squamous Epithelial / HPF 11-20 0 - 5 /HPF   Mucus PRESENT   Wet prep, genital     Status: Abnormal   Collection Time: 03/16/23 11:56 PM  Result Value Ref Range   Yeast Wet Prep HPF POC NONE SEEN NONE SEEN   Trich, Wet Prep NONE SEEN NONE SEEN   Clue Cells Wet Prep HPF POC PRESENT (A) NONE SEEN   WBC, Wet Prep HPF POC >=10 (A) <10   Sperm NONE SEEN   ABO/Rh     Status: None   Collection Time: 03/17/23 12:42 AM  Result Value Ref Range   ABO/RH(D) O POS    No rh immune globuloin      NOT A RH IMMUNE GLOBULIN CANDIDATE, PT RH POSITIVE Performed at [facility], [address], [place], [place] [address]   CBC     Status: Abnormal   Collection Time: 03/17/23 12:51 AM  Result Value Ref Range   WBC 14.3 (H) 4.0 - 10.5 K/uL   RBC 3.98 3.87 - 5.11 MIL/uL   Hemoglobin 10.5 (L) 12.0 - 15.0 g/dL   HCT 13.2 (L) 44.0 - 10.2 %   MCV 73.4 (L) 80.0 - 100.0 fL   MCH 26.4 26.0 - 34.0 pg   MCHC 36.0 30.0 - 36.0 g/dL   RDW 72.5 36.6 - 44.0 %   Platelets 166 150 - 400 K/uL   nRBC 0.6 (H) 0.0 - 0.2 %  Comprehensive metabolic panel     Status: Abnormal   Collection Time: 03/17/23 12:51 AM  Result Value Ref Range   Sodium 136 135 - 145 mmol/L   Potassium 3.6 3.5 - 5.1 mmol/L   Chloride 103 98 - 111 mmol/L   CO2 24 22 - 32 mmol/L   Glucose, Bld 88 70 - 99 mg/dL   BUN 7 6 - 20 mg/dL  Creatinine, Ser 0.58 0.44 - 1.00 mg/dL   Calcium 9.2  8.9 - 82.9 mg/dL   Total Protein 7.9 6.5 - 8.1 g/dL   Albumin 4.2 3.5 - 5.0 g/dL   AST 17 15 - 41 U/L   ALT 16 0 - 44 U/L   Alkaline Phosphatase 57 38 - 126 U/L   Total Bilirubin 2.8 (H) 0.3 - 1.2 mg/dL   GFR, Estimated >56 >21 mL/min   Anion gap 9 5 - 15  hCG, quantitative, pregnancy     Status: Abnormal   Collection Time: 03/17/23 12:51 AM  Result Value Ref Range   hCG, Beta Chain, Quant, S 21,108 (H) <5 mIU/mL   US OB LESS THAN 14 WEEKS WITH OB TRANSVAGINAL  Result Date: 03/17/2023 CLINICAL DATA:  Intermittent abdominal pain. Beta HCG pending. LMP 02/04/2023 EXAM: OBSTETRIC <14 WK [place] AND TRANSVAGINAL OB US TECHNIQUE: Both transabdominal and transvaginal ultrasound examinations were performed for complete evaluation of the gestation as well as the maternal uterus, adnexal regions, and pelvic cul-de-sac. Transvaginal technique was performed to assess early pregnancy. COMPARISON:  None Available. FINDINGS: Intrauterine gestational sac: Single Yolk sac:  Visualized. Embryo:  Visualized. Cardiac Activity: Visualized. Heart Rate: 85 bpm CRL:  3.6 mm   6 w   0 d                  [place] EDC: 11/10/2023 Subchorionic hemorrhage:  None visualized. Maternal uterus/adnexae: 3.8 x 4.3 x 4.1 cyst with internal reticulations and echoes in the right ovary. Normal left ovary. Trace free fluid in the pelvis. IMPRESSION: 1. Single living intrauterine gestation with heart rate of 85 beats per minute. 2. 4.3 cm complex cyst in the right ovary with appearance suggestive of hemorrhagic cyst. Electronically Signed   By: Minerva Fester M.D.   On: 03/17/2023 02:38    MDM Physical Exam Cultures: Wet Prep and GC/CT Labs: UA, UPT, CBC, CMP, hCG, ABO Ultrasound Prescriptions Assessment and Plan  22 year old G1P0  at 5.6 weeks N/V Abdominal Pain  -Reviewed POC with patient. -Exam performed and findings discussed.  -Reassured that nausea is anticipated complaint during pregnancy and frequency she is experiencing is  within normal range.  -Discussed usage of medications for management and patient agreeable. -Reviewed prescription for and usage of Bonjesta at home.  -Will give reglan now. -Send for [place] and await results.    Cherre Robins MSN, CNM Advanced Practice Provider, Center for [facility] 03/17/2023, 1:07 AM   Reassessment (2:28 AM) -Results as above -[place] still pending. -Provider reviews showing SIUP c/w dates and FHR of 85 bpm. -Provider to bedside to discuss findings. -Informed that [place] results pending, but would send updated mychart message with formal findings. Patient agreeable. -Discussed need to follow up in office and patient reports [place] on 11/18. -Reviewed results suggestive of BV.  Educated on diagnosis, causes, and treatment.  Patient desires oral treatment. -Precautions reviewed. -Message sent to [facility] to ensure patient has follow up US scheduled.  -Precautions reviewed. -Rx for Metronidazole and Bonjesta sent to pharmacy on file.  -Encouraged to call primary office or return to MAU if symptoms worsen or with the onset of new symptoms. -Discharged to home in stable condition.  Cherre Robins MSN, CNM Advanced Practice Provider, Center for [facility]   Reassessment (4:15 AM) -[place] returns with results as above. -Mychart message sent to patient.  Cherre Robins MSN, CNM Advanced Practice Provider, Center for [employer]

## 2023-03-19 LAB — GC/CHLAMYDIA PROBE AMP (~~LOC~~) NOT AT ARMC
Chlamydia: NEGATIVE
Comment: NEGATIVE
Comment: NORMAL
Neisseria Gonorrhea: NEGATIVE

## 2023-04-05 ENCOUNTER — Ambulatory Visit (HOSPITAL_COMMUNITY): Payer: Medicaid Other

## 2023-04-18 ENCOUNTER — Other Ambulatory Visit (INDEPENDENT_AMBULATORY_CARE_PROVIDER_SITE_OTHER): Payer: Medicaid Other

## 2023-04-18 ENCOUNTER — Ambulatory Visit: Payer: Medicaid Other

## 2023-04-18 VITALS — BP 113/73 | HR 74 | Wt 138.8 lb

## 2023-04-18 DIAGNOSIS — Z3A10 10 weeks gestation of pregnancy: Secondary | ICD-10-CM

## 2023-04-18 DIAGNOSIS — Z1339 Encounter for screening examination for other mental health and behavioral disorders: Secondary | ICD-10-CM

## 2023-04-18 DIAGNOSIS — Z3401 Encounter for supervision of normal first pregnancy, first trimester: Secondary | ICD-10-CM | POA: Diagnosis not present

## 2023-04-18 MED ORDER — BLOOD PRESSURE KIT DEVI: 1.0000 | 0 refills | Status: AC

## 2023-04-18 MED ORDER — GOJJI WEIGHT SCALE MISC: 1.0000 | 0 refills | Status: AC

## 2023-04-18 NOTE — Progress Notes (Signed)
New OB Intake  I connected with Ashley Hawkins  on 04/18/23 at  2:10 PM EST by in person and verified that I am speaking with the correct person using two identifiers. Nurse is located at [facility] and Ashley Hawkins is located at [facility].  I discussed the limitations, risks, security and privacy concerns of performing an evaluation and management service by telephone and the availability of in person appointments. I also discussed with the patient that there may be a patient responsible charge related to this service. The patient expressed understanding and agreed to proceed.  I explained I am completing New OB Intake today. We discussed EDD of 11/11/2023, by Last Menstrual Period. Ashley Hawkins is G1P0000. I reviewed her allergies, medications and Medical/Surgical/OB history.    Patient Active Problem List   Diagnosis Date Noted   Encounter for supervision of normal first pregnancy in first trimester 04/18/2023   Sickle cell trait (HCC) 05/06/2020    Concerns addressed today  Delivery Plans Plans to deliver at [facility]. Discussed the nature of our practice with multiple providers including residents and students. Due to the size of the practice, the delivering provider may not be the same as those providing prenatal care.   Patient is not interested in water birth. Offered upcoming OB visit with CNM to discuss further.  MyChart/Babyscripts MyChart access verified. I explained Ashley Hawkins will have some visits in office and some virtually. Babyscripts instructions given and order placed. Patient verifies receipt of registration text/e-mail. Account successfully created and app downloaded.  Blood Pressure Cuff/Weight Scale Blood pressure cuff ordered for patient to pick-up from [employer]. Explained after first prenatal appt Ashley Hawkins will check weekly and document in Babyscripts. Patient does not have weight scale; order sent to [facility], patient may track weight weekly in Babyscripts.  Anatomy US Explained  first scheduled [place] will be around 19 weeks. Anatomy US TBD.  Is patient a CenteringPregnancy candidate?  Declined Declined due to Group setting Not a candidate due to  If accepted,    Is patient a Mom+Baby Combined Care candidate?  Declined   If accepted, confirm patient does not intend to move from the area for at least 12 months, then notify Mom+Baby staff  Interested in [place]? If yes, send referral and doula dot phrase.   Is patient a candidate for Babyscripts Optimization? Yes, patient declined   First visit review I reviewed new OB appt with patient. Explained Ashley Hawkins will be seen by Dorathy Daft at first visit. Discussed Avelina Laine genetic screening with patient. Interested Paramedic. Collected today. Routine prenatal labs is collected   Last Pap Diagnosis  Date Value Ref Range Status  12/14/2021 (A)  Final   - Atypical squamous cells of undetermined significance (ASC-US)    Hamilton Capri, RN 04/18/2023  2:40 PM

## 2023-04-20 LAB — URINE CULTURE, OB REFLEX

## 2023-04-20 LAB — CULTURE, OB URINE

## 2023-04-25 LAB — PANORAMA PRENATAL TEST FULL PANEL:PANORAMA TEST PLUS 5 ADDITIONAL MICRODELETIONS: FETAL FRACTION: 11.6

## 2023-04-26 ENCOUNTER — Encounter: Payer: Self-pay | Admitting: Family Medicine

## 2023-04-26 DIAGNOSIS — O289 Unspecified abnormal findings on antenatal screening of mother: Secondary | ICD-10-CM | POA: Insufficient documentation

## 2023-04-26 LAB — CBC/D/PLT+RPR+RH+ABO+RUBIGG...
Basophils Absolute: 0 10*3/uL (ref 0.0–0.2)
Basos: 0 %
EOS (ABSOLUTE): 0.2 10*3/uL (ref 0.0–0.4)
Eos: 1 %
HCV Ab: NONREACTIVE
HIV Screen 4th Generation wRfx: NONREACTIVE
Hematocrit: 31.4 % — ABNORMAL LOW (ref 34.0–46.6)
Hemoglobin: 10.2 g/dL — ABNORMAL LOW (ref 11.1–15.9)
Hepatitis B Surface Ag: NEGATIVE
Immature Grans (Abs): 0.1 10*3/uL (ref 0.0–0.1)
Immature Granulocytes: 1 %
Lymphocytes Absolute: 3 10*3/uL (ref 0.7–3.1)
Lymphs: 23 %
MCH: 27.1 pg (ref 26.6–33.0)
MCHC: 32.5 g/dL (ref 31.5–35.7)
MCV: 83 fL (ref 79–97)
Monocytes Absolute: 0.8 10*3/uL (ref 0.1–0.9)
Monocytes: 6 %
Neutrophils Absolute: 8.9 10*3/uL — ABNORMAL HIGH (ref 1.4–7.0)
Neutrophils: 69 %
Platelets: 166 10*3/uL (ref 150–450)
RBC: 3.77 x10E6/uL (ref 3.77–5.28)
RDW: 16.6 % — ABNORMAL HIGH (ref 11.7–15.4)
RPR Ser Ql: NONREACTIVE
Rh Factor: POSITIVE
Rubella Antibodies, IGG: 21.8 {index} (ref >0.99)
WBC: 13 10*3/uL — ABNORMAL HIGH (ref 3.4–10.8)

## 2023-04-26 LAB — HEMOGLOBIN A1C
Est. average glucose Bld gHb Est-mCnc: <74 mg/dL
Hgb A1c MFr Bld: <4.2 % — ABNORMAL LOW (ref 4.8–5.6)

## 2023-04-26 LAB — AB SCR+ANTIBODY ID: Antibody Screen: POSITIVE — AB

## 2023-04-26 LAB — HCV INTERPRETATION

## 2023-04-27 ENCOUNTER — Other Ambulatory Visit (HOSPITAL_COMMUNITY)
Admission: RE | Admit: 2023-04-27 | Discharge: 2023-04-27 | Disposition: A | Discharge status: Home / Self Care | Payer: Medicaid Other | Source: Ambulatory Visit | Attending: Certified Nurse Midwife | Admitting: Certified Nurse Midwife

## 2023-04-27 ENCOUNTER — Ambulatory Visit (INDEPENDENT_AMBULATORY_CARE_PROVIDER_SITE_OTHER): Payer: Medicaid Other | Admitting: Certified Nurse Midwife

## 2023-04-27 ENCOUNTER — Encounter: Payer: Self-pay | Admitting: Certified Nurse Midwife

## 2023-04-27 VITALS — BP 109/58 | HR 69 | Wt 138.6 lb

## 2023-04-27 DIAGNOSIS — R87610 Atypical squamous cells of undetermined significance on cytologic smear of cervix (ASC-US): Secondary | ICD-10-CM | POA: Diagnosis not present

## 2023-04-27 DIAGNOSIS — O360110 Maternal care for anti-D [Rh] antibodies, first trimester, not applicable or unspecified: Secondary | ICD-10-CM | POA: Diagnosis not present

## 2023-04-27 DIAGNOSIS — Z3A11 11 weeks gestation of pregnancy: Secondary | ICD-10-CM | POA: Insufficient documentation

## 2023-04-27 DIAGNOSIS — Z3402 Encounter for supervision of normal first pregnancy, second trimester: Secondary | ICD-10-CM | POA: Diagnosis not present

## 2023-04-27 DIAGNOSIS — O360120 Maternal care for anti-D [Rh] antibodies, second trimester, not applicable or unspecified: Secondary | ICD-10-CM | POA: Diagnosis not present

## 2023-04-27 DIAGNOSIS — Z3A12 12 weeks gestation of pregnancy: Secondary | ICD-10-CM | POA: Diagnosis present

## 2023-04-27 NOTE — Progress Notes (Unsigned)
Pt presents for NOB visit  Last PAP 11/2021 ASC-US

## 2023-04-30 DIAGNOSIS — O360190 Maternal care for anti-D [Rh] antibodies, unspecified trimester, not applicable or unspecified: Secondary | ICD-10-CM | POA: Insufficient documentation

## 2023-04-30 NOTE — Progress Notes (Signed)
History:   Ashley Hawkins is a 22 y.o. G1P0000 at [redacted]w[redacted]d by LMP being seen today for her first obstetrical visit.  Her obstetrical history is significant for  N/A  . Patient does intend to breast feed. Pregnancy history fully reviewed.  Patient reports no complaints. She is disappointed that her mother has told her the gender of the baby but she is well.       HISTORY: OB History  Gravida Para Term Preterm AB Living  1 0 0 0 0 0  SAB IAB Ectopic Multiple Live Births  0 0 0 0 0    # Outcome Date GA Lbr Len/2nd Weight Sex Type Anes PTL Lv  1 Current             Last pap smear was done 12/14/2021  and was abnormal - ASCUS HPV neg.   Past Medical History:  Diagnosis Date   Sickle cell trait (HCC) 05/06/2020   Past Surgical History:  Procedure Laterality Date   APPENDECTOMY  2019   Family History  Problem Relation Age of Onset   Hypertension Maternal Grandmother    Hypertension Maternal Uncle    Social History   Tobacco Use   Smoking status: Never    Passive exposure: Yes   Smokeless tobacco: Never  Vaping Use   Vaping status: Never Used  Substance Use Topics   Alcohol use: No    Alcohol/week: 0.0 standard drinks of alcohol   Drug use: No   Allergies  Allergen Reactions   Codeine Hives   Current Outpatient Medications on File Prior to Visit  Medication Sig Dispense Refill   Blood Pressure Monitoring (BLOOD PRESSURE KIT) DEVI 1 kit by Does not apply route once a week. 1 each 0   Prenat-FeCbn-FeAsp-Meth-FA-DHA (PRENATE MINI) 18-0.6-0.4-350 MG CAPS Take 1 tablet by mouth daily. 30 capsule 11   Doxylamine-Pyridoxine ER (BONJESTA) 20-20 MG TBCR Take 1 tablet by mouth at bedtime. May increase to once during day if needed.  Do not take more than 2 pills per day. (Patient not taking: Reported on 04/27/2023) 60 tablet 2   metroNIDAZOLE (FLAGYL) 500 MG tablet Take 1 tablet (500 mg total) by mouth 2 (two) times daily. (Patient not taking: Reported on 04/18/2023) 14 tablet 0    Misc. Devices (GOJJI WEIGHT SCALE) MISC 1 Device by Does not apply route every 30 (thirty) days. (Patient not taking: Reported on 04/27/2023) 1 each 0   No current facility-administered medications on file prior to visit.    Review of Systems Pertinent items noted in HPI and remainder of comprehensive ROS otherwise negative.  Indications for ASA therapy (per UpToDate) One of the following: Previous pregnancy with preeclampsia, especially early onset and with an adverse outcome No Multifetal gestation No Chronic hypertension No Type 1 or 2 diabetes mellitus No Chronic kidney disease No Autoimmune disease (antiphospholipid syndrome, systemic lupus erythematosus) No Two or more of the following: Nulliparity Yes Obesity (body mass index >30 kg/m2) No Family history of preeclampsia in mother or sister No Age >=35 years No Sociodemographic characteristics (African American race, low socioeconomic level) Yes Personal risk factors (eg, previous pregnancy with low birth weight or small for gestational age infant, previous adverse pregnancy outcome [eg, stillbirth], interval >10 years between pregnancies) No  Indications for early GDM screening (FBS, A1C, Random CBG, GTT) First-degree relative with diabetes No BMI >30kg/m2 No Age > 35 No Previous birth of an infant weighing >=4000 g No Gestational diabetes mellitus in a previous pregnancy  No Glycated hemoglobin >=5.7 percent (39 mmol/mol), impaired glucose tolerance, or impaired fasting glucose on previous testing No High-risk race/ethnicity (eg, African American, Latino, Native American, Asian American, Pacific Islander) Yes Previous stillbirth of unknown cause No Maternal birthweight > 9 lbs No History of cardiovascular disease No Hypertension or on therapy for hypertension No High-density lipoprotein cholesterol level <35 mg/dL (7.82 mmol/L) and/or a triglyceride level >250 mg/dL (9.56 mmol/L) No Polycystic ovary syndrome No Physical  inactivity No Other clinical condition associated with insulin resistance (eg, severe obesity, acanthosis nigricans) No Current use of glucocorticoids No   Physical Exam:   Vitals:   04/27/23 0934  BP: (!) 109/58  Pulse: 69  Weight: 138 lb 9.6 oz (62.9 kg)   Fetal Heart Rate (bpm): 155  General: well-developed, well-nourished female in no acute distress     Skin: normal coloration and turgor, no rashes  Neurologic: oriented, normal, negative, normal mood  Extremities: normal strength, tone, and muscle mass, ROM of all joints is normal  HEENT PERRLA, extraocular movement intact and sclera clear, anicteric  Neck supple and no masses  Cardiovascular: regular rate and rhythm  Respiratory:  no respiratory distress, normal breath sounds  Abdomen: soft, non-tender; bowel sounds normal; no masses,  no organomegaly  Pelvic: normal external genitalia, no lesions, normal vaginal mucosa, normal vaginal discharge, normal cervix, pap smear done. Exam done in the presence of a chaperone.     Assessment:    Pregnancy: G1P0000 Patient Active Problem List   Diagnosis Date Noted   Abnormal antibody screen 04/26/2023   Encounter for supervision of normal first pregnancy in first trimester 04/18/2023   Sickle cell trait (HCC) 05/06/2020     Plan:    1. Encounter for supervision of low-risk first pregnancy in second trimester - Patient doing well.  - Cytology - PAP( [facility]) - Antibody screen - [place] MFM OB DETAIL +14 WK; Future  2. 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 No previous history of Anti-D antibodies.   3. ASCUS of cervix with negative high risk HPV - Repeat PAP collected today.   4. [redacted] weeks gestation of pregnancy - Reviewed 2nd trimester expectations.    Initial labs drawn. Continue prenatal vitamins. Problem list reviewed and updated. Genetic Screening discussed, Panorama and  Horizon: results reviewed. Ultrasound discussed; fetal anatomic survey: planned. Anticipatory guidance about prenatal visits given including labs, ultrasounds, and testing. Weight gain recommendations per IOM guidelines reviewed: underweight/BMI 18.5 or less > 28 - 40 lbs; normal weight/BMI 18.5 - 24.9 > 25 - 35 lbs; overweight/BMI 25 - 29.9 > 15 - 25 lbs; obese/BMI  30 or more > 11 - 20 lbs. Discussed usage of the Babyscripts app for more information about pregnancy, and to track blood pressures. Also discussed usage of virtual visits as additional source of managing and completing prenatal visits.  Patient was encouraged to use MyChart to review results, send requests, and have questions addressed.   The nature of Center for [facility]/Faculty Practice with multiple MDs and Advanced Practice Providers was explained to patient; also emphasized that residents, students are part of our team. Routine obstetric precautions reviewed. Encouraged to seek out care at our office or emergency room [facility] preferred) for urgent and/or emergent concerns. Return in about 4 weeks (around 05/25/2023) for LOB.     Seif Teichert Danella Deis) Suzie Portela, MSN, CNM  Center for [facility]  04/30/2023 12:04 AM

## 2023-05-01 LAB — HORIZON CUSTOM: REPORT SUMMARY: POSITIVE — AB

## 2023-05-02 DIAGNOSIS — Z3401 Encounter for supervision of normal first pregnancy, first trimester: Secondary | ICD-10-CM | POA: Diagnosis not present

## 2023-05-02 LAB — CYTOLOGY - PAP: Diagnosis: NEGATIVE

## 2023-05-04 LAB — ANTIBODY SCREEN

## 2023-05-04 LAB — AB SCR+ANTIBODY ID

## 2023-05-07 ENCOUNTER — Encounter: Payer: Self-pay | Admitting: Family Medicine

## 2023-05-07 DIAGNOSIS — Z148 Genetic carrier of other disease: Secondary | ICD-10-CM | POA: Insufficient documentation

## 2023-05-07 DIAGNOSIS — D582 Other hemoglobinopathies: Secondary | ICD-10-CM | POA: Insufficient documentation

## 2023-05-09 ENCOUNTER — Other Ambulatory Visit: Payer: Self-pay | Admitting: *Deleted

## 2023-05-09 DIAGNOSIS — O285 Abnormal chromosomal and genetic finding on antenatal screening of mother: Secondary | ICD-10-CM

## 2023-05-23 NOTE — L&D Delivery Note (Signed)
 OB/GYN Faculty Practice Delivery Note  Ashley Hawkins is a 23 y.o. G1P0000 s/p SVD at [redacted]w[redacted]d. She was admitted for IOL for gHTN. Progressed to preE w/SF based on pressures during IOL.   ROM: 9h 19m with clear fluid GBS Status: Negative/-- (05/27 1115) Maximum Maternal Temperature: Temp (24hrs), Avg:98.3 F (36.8 C), Min:97.7 F (36.5 C), Max:99 F (37.2 C)   Labor Progress: Initial SVE: 1.5/50/-2. AROM and Pitocin required. She then progressed to complete.   Delivery Date/Time: 10/23/23 2028 Delivery: Called to room and patient was complete and pushing. Head delivered OA to LOA. No nuchal cord present. Shoulder and body delivered in usual fashion. Infant with spontaneous cry, placed on mother's abdomen, dried and stimulated. Cord clamped x 2 after +1-minute delay, and cut by maternal grandma. Cord gases not obtained. Cord blood drawn. Placenta delivered spontaneously with gentle cord traction. Fundus firm with massage and Pitocin. Labia, perineum, and vagina inspected with 2nd degree perineal and right labial lacerations. Vessel briskly bleeding from perineal tear; this was repaired with 3-0 vicryl. Additional interrupted sutures of 3-0 vicryl used to reinforce perineal body, then rest of second degree was repaired in the usual fashion with same suture. Rectal exam was intact. Periurethral tear was not hemostatic and required figure-of-eight with 4-0 vicryl for hemostasis. Mom and baby to postpartum. Baby Weight: pending  Placenta: 3 vessel, intact. Sent to L&D Complications: preeclampsia w/SF Lacerations: 2nd degree perineal, right periurethral EBL: 700 mL Anesthesia: epidural  Infant: baby girl A'zurie APGAR (1 MIN): 9  APGAR (5 MINS): 9  APGAR (10 MINS):    Maud Sorenson, MD [facility] Family Medicine Fellow, [facility] for [facility], [facility] 10/23/2023, 9:04 PM

## 2023-05-25 ENCOUNTER — Ambulatory Visit (INDEPENDENT_AMBULATORY_CARE_PROVIDER_SITE_OTHER): Payer: Medicaid Other | Admitting: Obstetrics & Gynecology

## 2023-05-25 VITALS — Wt 141.7 lb

## 2023-05-25 DIAGNOSIS — O99012 Anemia complicating pregnancy, second trimester: Secondary | ICD-10-CM

## 2023-05-25 DIAGNOSIS — Z3401 Encounter for supervision of normal first pregnancy, first trimester: Secondary | ICD-10-CM

## 2023-05-25 DIAGNOSIS — Z23 Encounter for immunization: Secondary | ICD-10-CM

## 2023-05-25 DIAGNOSIS — Z3402 Encounter for supervision of normal first pregnancy, second trimester: Secondary | ICD-10-CM

## 2023-05-25 DIAGNOSIS — Z3A15 15 weeks gestation of pregnancy: Secondary | ICD-10-CM

## 2023-05-25 DIAGNOSIS — Z148 Genetic carrier of other disease: Secondary | ICD-10-CM

## 2023-05-25 DIAGNOSIS — D573 Sickle-cell trait: Secondary | ICD-10-CM

## 2023-05-25 DIAGNOSIS — Z3492 Encounter for supervision of normal pregnancy, unspecified, second trimester: Secondary | ICD-10-CM | POA: Diagnosis not present

## 2023-05-25 NOTE — Addendum Note (Signed)
 Addended by: Sharlyne Pacas on: 05/25/2023 12:22 PM   Modules accepted: Orders

## 2023-05-27 LAB — AFP, SERUM, OPEN SPINA BIFIDA
AFP MoM: 0.82
AFP Value: 29.1 ng/mL
Gest. Age on Collection Date: 15.5 wk
Maternal Age At EDD: 23.5 a
OSBR Risk 1 IN: 10000
Test Results:: NEGATIVE
Weight: 141 [lb_av]

## 2023-05-30 ENCOUNTER — Telehealth: Payer: Self-pay

## 2023-05-30 NOTE — Telephone Encounter (Signed)
 Returned call and advised pt that BP [facility]

## 2023-05-31 NOTE — Progress Notes (Addendum)
   PRENATAL VISIT NOTE  Subjective:  Ashley Hawkins is a 23 y.o. G1P0000 at [redacted]w[redacted]d being seen today for ongoing prenatal care.  She is currently monitored for the following issues for this low-risk pregnancy and has Sickle cell trait (HCC); Encounter for supervision of normal first pregnancy in first trimester; Abnormal antibody screen; Anti-D antibodies present during pregnancy; Carrier of spinal muscular atrophy; and Hemoglobin C (Hb-C) (HCC) on their problem list.  Patient reports no complaints.  Contractions: Not present. Vag. Bleeding: None.  Movement: Present. Denies leaking of fluid.   The following portions of the patient's history were reviewed and updated as appropriate: allergies, current medications, past family history, past medical history, past social history, past surgical history and problem list.   Objective:   Vitals:   05/25/23 1126  Weight: 141 lb 11.2 oz (64.3 kg)   FH- 16 FHR- 150s Fetal Status:     Movement: Present     General:  Alert, oriented and cooperative. Patient is in no acute distress.  Skin: Skin is warm and dry. No rash noted.   Cardiovascular: Normal heart rate noted  Respiratory: Normal respiratory effort, no problems with respiration noted  Abdomen: Soft, gravid, appropriate for gestational age.  Pain/Pressure: Present     Pelvic: Cervical exam deferred        Extremities: Normal range of motion.  Edema: None  Mental Status: Normal mood and affect. Normal behavior. Normal judgment and thought content.   Assessment and Plan:  Pregnancy: G1P0000 at [redacted]w[redacted]d 1. Carrier of spinal muscular atrophy (Primary) - She has been advised that FOB can be tested. He is currently not involved.  2. Sickle cell trait (HCC) - She has been advised that FOB can be tested. - She has never had a sickle cell crisis  3. Encounter for supervision of low-risk first pregnancy in second trimester   4. [redacted] weeks gestation of pregnancy  - AFP, Serum, Open Spina  Bifida - Flu vaccine trivalent PF, 6mos and older(Flulaval,Afluria,Fluarix,Fluzone)  5. Encounter for supervision of normal first pregnancy in first trimester   Preterm labor symptoms and general obstetric precautions including but not limited to vaginal bleeding, contractions, leaking of fluid and fetal movement were reviewed in detail with the patient. Please refer to After Visit Summary for other counseling recommendations.   No follow-ups on file.  Future Appointments  Date Time Provider Department Center  06/04/2023 11:00 AM [facility] GENETIC RM [facility] None  06/25/2023 11:15 AM Leftwich-Kirby, Olam DELENA, CNM [facility] None  06/26/2023  1:15 PM [facility] NURSE [facility]  06/26/2023  1:30 PM [facility] US5 [facility]    Harland JAYSON Birkenhead, MD

## 2023-06-04 ENCOUNTER — Other Ambulatory Visit: Payer: Self-pay

## 2023-06-04 ENCOUNTER — Ambulatory Visit: Payer: Medicaid Other | Attending: Obstetrics and Gynecology | Admitting: Obstetrics and Gynecology

## 2023-06-04 ENCOUNTER — Ambulatory Visit: Payer: Self-pay | Admitting: Obstetrics and Gynecology

## 2023-06-04 DIAGNOSIS — D582 Other hemoglobinopathies: Secondary | ICD-10-CM

## 2023-06-04 DIAGNOSIS — O99012 Anemia complicating pregnancy, second trimester: Secondary | ICD-10-CM

## 2023-06-04 DIAGNOSIS — Z3A17 17 weeks gestation of pregnancy: Secondary | ICD-10-CM

## 2023-06-04 NOTE — Progress Notes (Signed)
 Virtual Visit via Video Note  I connected with Ashley Hawkins on 06/04/23 at 11:00 AM EST by a video enabled telemedicine application and verified that I am speaking with the correct person using two identifiers.  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  The patient was present on this virtual visit alone.  Maternal Hemoglobin C Disease. Ashley Hawkins is affected with Hemoglobin C disease (Hb C/C). Hemoglobin C Disease can reduce the number and size of red blood cells which may cause mild anemia. The anemia may or may not require treatment. Jaundice, enlarged spleen, and gallstones occur occasionally in affected individuals. Specifically, Ashley Hawkins had newborn screening followed by hemoglobin fractionation cascade in 2002 which showed a pattern consistent with Hemoglobin C Disease. 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 Given this result, we discussed the natural history and autosomal recessive inheritance pattern associated with Beta-Hemoglobinopathies. We additionally reviewed that all of Ashley Hawkins's biological children will inherit the Hemoglobin C trait from her. We discussed that if the father of the pregnancy is found to also be a carrier for Hemoglobin C trait (Hb A/C), there would be a 50% risk for their offspring together to be affected with Hemoglobin C Disease (Hb C/C). If he is found to be a carrier for Sickle Cell Disease (Hb A/S), there would be a 50% risk for their offspring together to be  affected with Sickle-Hemoglobin C Disease (Hb S/C). With this condition red blood cells have an abnormal sickle shape which leads to more severe anemia than in Hemoglobin C Disease and pain crises can occur when sickled red blood cells get stuck in small blood vessels. If the father of the pregnancy is found to be a carrier of Beta-Thalassemia, there would be a 50% risk for their offspring together to be affected with Hemoglobin C Beta-Thalassemia (Hb C/?+ or Hb C/?). Individuals with Hemoglobin C Beta-Thalassemia typically have moderate to severe anemia and splenic enlargement. Other Beta-globin chain variants such as Hemoglobin O-Arab and Hemoglobin E can cause disease in an individual when inherited with Hemoglobin C trait. If he screened to be a carrier for one of these other types of hemoglobin there would be a 50% chance for their offspring together to be affected.   Given the above information, genetic counseling recommended screening the father of the pregnancy for Beta Hemoglobinopathies. If both members of a couple are known to be carriers, diagnostic testing is available in this or any future pregnancy to determine if the pregnancy is affected. This can be performed via CVS at 10-[redacted] weeks gestation or amniocentesis after [redacted] weeks gestation. CVS involves the withdrawal of a small amount of chorionic villi (tissue from the developing placenta). Amniocentesis involves the removal of a small amount of amniotic fluid from the sac surrounding the pregnancy. Ultrasound guidance is used throughout both procedures. Possible procedural difficulties and complications that can arise with these procedures include maternal infection, cramping, bleeding, fluid leakage, and/or pregnancy loss. The risk for pregnancy loss with a  CVS/amniocentesis is 1/500. If diagnostic testing via CVS/amniocentesis is not desired, Beta Hemoglobinopathy testing can be completed after birth and is included in Ashley Hawkins 's Newborn  Screening Program.   Spinal Muscular Atrophy: SMA is a recessive genetic condition with variable age of onset and severity caused by mutations in the SMN1 gene.  To review, a recessive condition occurs in a child when both the mother and the father are carriers of the genetic condition and both pass on that altered gene to the child.  The features of SMA are caused by the loss of motor neurons which leads to progressive muscle weakness and muscle wasting (atrophy) in infancy.  There are several types of SMA based upon severity, but in the most common type (type 1), children may pass away as early as 31 years of age.  Carrier testing assesses the number of copies of the SMN1 gene, from zero to four.  Persons with one copy of the SMN1 gene are carriers, and those with no copies are affected with the condition.  Individuals with two or more copies have a reduced chance to be a carrier.  Persons with 3 or 4 copies of this gene are highly unlikely to be carriers.  However, someone with two copies of the gene can either have two copies of this gene on separate chromosomes and thus not be a carrier or they could have two copies of this gene on the same chromosome with no copies on the other chromosome and be a "silent" carrier.  This silent carrier status is known to be more common in persons of African American or Ashkenazi Jewish ancestry, particularly when they are found to be positive for a variant in the gene known as c. *3+80T>G. The results revealed that Ashley Hawkins has an SMN1 copy number of 2 and is positive for the c. *3+80T>G SNP, thus increasing her chance to be a carrier from 1 in 66 to 1 in 34.  This testing cannot confirm or eliminate the chance to have a child with SMA. It is estimated that current carrier screening can detect >95% of carriers in the Caucasian population and >71% of carriers in the African American population.    Then next option we reviewed is to have the father of the pregnancy tested to  determine his status.  If he is found to be a carrier, then the pregnancy may be at increased risk for SMA and testing would be offered during the pregnancy through amniocentesis or at the time of birth. At this time, prior to testing, Ashley Hawkins has a chance of 1 in 12 to be a carrier given his African American ancestry and no known family history of SMA.  Overall, the chance for this couple to have a child with SMA at this time is 1/66 X 1/34 X = 1 in 8,976. If he were to have testing that showed a low chance for him to be a carrier, this number could be decreased significantly.  If his testing were to show that he is a carrier, then the number would be increased.  We also talked about the possibility of testing at the time of delivery. Beginning in 2021, all newborn babies in [place] are test for SMA as well as numerous other conditions as part of state mandated newborn screening. Testing on cord blood or of the baby sometime after delivery could also be ordered if desired.   Family history and pregnancy history: We obtained a detailed family history and pregnancy history.  The family history was reported to be unremarkable for birth defects, intellectual delays, recurrent pregnancy loss or known chromosome abnormalities.  Ms. Gutzwiller reported that this is her first pregnancy. She has had no complications or exposure to medications, alcohol, tobacco or recreational drugs in this pregnancy.  The father of the pregnancy is not current involved and medical information about his family is limited.  Additional prior testing: The results of the [employer] cell free DNA testing were also reviewed.  These results showed a less than 1 in 10,000 risk for trisomies 21, 18 and 13, and monosomy X (Turner syndrome). In addition, the risk for triploidy and sex chromosome trisomies (47,XXX and 47,XXY) was also low. Ms. Troost elected to have cfDNA analysis for 22q11.2 deletion syndrome, which was also low risk (1 in 12,000).  While this testing identifies 94-99% of pregnancies with trisomy 4, trisomy 38, and trisomy 1, and >70% of cases of sex chromosome aneuploidies, it is NOT diagnostic. A positive test result requires confirmation by CVS or amniocentesis, and a negative test result does not rule out a fetal chromosome abnormality. This testing does not identify all genetic conditions.  Screening for open neural tube defects was performed by her OB.  The maternal serum AFP only results were within normal limits, showing a chance of 1 in 10,000 for open spina bifida.  Additional carrier screening for cystic fibrosis and alpha hemoglobinopathies was negative.  This greatly reduces, but cannot eliminate, the chance for the patient to have a child with these conditions.  Plan of Care: Shakemia will consider the option of testing the father of the pregnancy, Ashley Hawkins Dassit.  If they desire his testing, she will reach out to arrange for either a lab visit or for a saliva kit to be sent to his home. If she declines his carrier screening, Louie plans to follow up with newborn screening results with her pediatrician after birth.   She has seen a hematologist in the past and had no additional questions about her condition, which has caused her no health concerns thus far.  We were clear to state that this was different from sickle cell disease, as her records indicate in multiple places that she has sickle cell trait or sickle cell disease. She has hemoglobin C disease, as she is homozygous for CC.  Ms. Hults was encouraged to call with questions or concerns.  We can be contacted at [phone].  Barnie PHEBE Dixons, MS, CGC  In total, 75 minutes were spent on the date of the encounter in service to the patient including preparation, face-to-face/virtual video consultation, documentation and care coordination.   Dixons Barnie

## 2023-06-11 ENCOUNTER — Telehealth: Payer: Self-pay | Admitting: Licensed Clinical Social Worker

## 2023-06-11 NOTE — Telephone Encounter (Signed)
[facility] contacted patient on this date to provide [facility] intro and to schedule [facility] appointment. Appointment scheduled for 06/14/2023

## 2023-06-14 ENCOUNTER — Ambulatory Visit (INDEPENDENT_AMBULATORY_CARE_PROVIDER_SITE_OTHER): Payer: Medicaid Other | Admitting: Licensed Clinical Social Worker

## 2023-06-14 DIAGNOSIS — F4322 Adjustment disorder with anxiety: Secondary | ICD-10-CM

## 2023-06-14 NOTE — BH Specialist Note (Signed)
Integrated Behavioral Health Initial In-Person Visit  MRN: [id number] Name: Ashley Hawkins  Number of Integrated Behavioral Health Clinician visits: 1- Initial Visit  Session Start time: 0945    Session End time: 1018  Total time in minutes: 33   Types of Service: Individual psychotherapy  Interpretor:No. Interpretor Name and Language: none    Warm Hand Off Completed.        Subjective: Ashley Hawkins is a 23 y.o. female accompanied by  self Patient was referred by provider for anxiety. Patient reports the following symptoms/concerns: increased anxiety symptoms stemming from life after pregnancy.  Duration of problem: Months; Severity of problem: moderate  Objective: Mood: Anxious and Affect: Appropriate Risk of harm to self or others: No plan to harm self or others  Life Context: Family and Social: Patient lives with her sister.  School/Work: Patient works part time at [employer]: Patient loves to sleep, organize, clean, color and dance.  Life Changes: Patient is currently pregnant/due June 2025.   Patient and/or Family's Strengths/Protective Factors: Social connections, Concrete supports in place (healthy food, safe environments, etc.), and Caregiver has knowledge of parenting & child development  Goals Addressed: Patient will: Reduce symptoms of: anxiety Increase knowledge and/or ability of: coping skills and healthy habits  Demonstrate ability to: Increase healthy adjustment to current life circumstances  Progress towards Goals: Ongoing  Interventions: Interventions utilized: Mindfulness or Management consultant, Supportive Counseling, Psychoeducation and/or Health Education, and Supportive Reflection  Standardized Assessments completed: Not Needed  Patient and/or Family Response: The patient worked on processing worries about life after the birth of her baby, expressing concerns about what the future may look like. She shared that the father of  the baby is not involved, but she feels supported by family, friends, and coworkers. The patient reported setting goals, including saving for her own apartment and preparing for the baby's arrival. She demonstrates an understanding of her anxiety symptoms and explored strategies to decrease anxiety. Additionally, the patient shows awareness of childbirth preparation and the postpartum period, expressing a readiness to lean on trusted supports when needed.  Patient Centered Plan: Patient is on the following Treatment Plan(s):  Adjustments   Assessment: Patient currently experiencing anxiety about the changes and challenges that may arise after the birth of her baby, particularly around the absence of the baby's father. Despite these concerns, she feels supported by her family, friends, and coworkers, and is actively preparing for her baby's arrival while focusing on personal goals like securing her own apartment.   Patient may benefit from continued support from integrative behavioral health.  Plan: Follow up with behavioral health clinician on : No follow up scheduled.  Behavioral recommendations: [name] to continue using anxiety-reducing strategies, such as mindfulness or relaxation techniques, to manage her concerns about the future. Additionally, continue to maintain your focus on preparation for the baby's arrival, while leaning on your support system as needed to help alleviate stress and stay grounded in your goals. Referral(s): Integrated Behavioral Health Services (In Clinic) "From scale of 1-10, how likely are you to follow plan?": Patient   [name], LCSWA

## 2023-06-25 ENCOUNTER — Ambulatory Visit (INDEPENDENT_AMBULATORY_CARE_PROVIDER_SITE_OTHER): Payer: Medicaid Other | Admitting: Obstetrics

## 2023-06-25 VITALS — BP 120/63 | HR 83 | Wt 150.9 lb

## 2023-06-25 DIAGNOSIS — Z3402 Encounter for supervision of normal first pregnancy, second trimester: Secondary | ICD-10-CM

## 2023-06-25 DIAGNOSIS — D573 Sickle-cell trait: Secondary | ICD-10-CM

## 2023-06-25 DIAGNOSIS — Z148 Genetic carrier of other disease: Secondary | ICD-10-CM

## 2023-06-25 NOTE — Progress Notes (Signed)
Subjective:  Ashley Hawkins is a 23 y.o. G1P0000 at [redacted]w[redacted]d being seen today for ongoing prenatal care.  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  Patient reports no complaints.  Contractions: Not present. Vag. Bleeding: None.  Movement: Present. Denies leaking of fluid.   The following portions of the patient's history were reviewed and updated as appropriate: allergies, current medications, past family history, past medical history, past social history, past surgical history and problem list. Problem list updated.  Objective:   Vitals:   06/25/23 1104  BP: 120/63  Pulse: 83  Weight: 150 lb 14.4 oz (68.4 kg)    Fetal Status: Fetal Heart Rate (bpm): 144   Movement: Present     General:  Alert, oriented and cooperative. Patient is in no acute distress.  Skin: Skin is warm and dry. No rash noted.   Cardiovascular: Normal heart rate noted  Respiratory: Normal respiratory effort, no problems with respiration noted  Abdomen: Soft, gravid, appropriate for gestational age. Pain/Pressure: Present     Pelvic:  Cervical exam deferred        Extremities: Normal range of motion.  Edema: Trace  Mental Status: Normal mood and affect. Normal behavior. Normal judgment and thought content.   Urinalysis:      Assessment and Plan:  Pregnancy: G1P0000 at [redacted]w[redacted]d  1. Encounter for supervision of low-risk first pregnancy in second trimester (Primary)  2. Sickle cell trait (HCC)  3. Carrier of spinal muscular atrophy - partner testing, declined   Preterm labor symptoms and general obstetric precautions including but not limited to vaginal bleeding, contractions, leaking of fluid and fetal  movement were reviewed in detail with the patient. Please refer to After Visit Summary for other counseling recommendations.   Return in about 4 weeks (around 07/23/2023).   Brock Bad, MD 06/25/2023

## 2023-06-25 NOTE — Progress Notes (Signed)
 Pt presents for ROB. No questions or concerns.

## 2023-06-26 ENCOUNTER — Other Ambulatory Visit: Payer: Self-pay

## 2023-06-26 ENCOUNTER — Ambulatory Visit: Payer: Medicaid Other | Attending: Obstetrics and Gynecology | Admitting: *Deleted

## 2023-06-26 ENCOUNTER — Ambulatory Visit (HOSPITAL_BASED_OUTPATIENT_CLINIC_OR_DEPARTMENT_OTHER): Payer: Medicaid Other

## 2023-06-26 ENCOUNTER — Other Ambulatory Visit: Payer: Self-pay | Admitting: *Deleted

## 2023-06-26 ENCOUNTER — Ambulatory Visit (HOSPITAL_BASED_OUTPATIENT_CLINIC_OR_DEPARTMENT_OTHER): Payer: Medicaid Other | Admitting: Obstetrics and Gynecology

## 2023-06-26 VITALS — BP 120/64 | HR 70

## 2023-06-26 DIAGNOSIS — Z148 Genetic carrier of other disease: Secondary | ICD-10-CM | POA: Diagnosis not present

## 2023-06-26 DIAGNOSIS — O99012 Anemia complicating pregnancy, second trimester: Secondary | ICD-10-CM | POA: Diagnosis not present

## 2023-06-26 DIAGNOSIS — D582 Other hemoglobinopathies: Secondary | ICD-10-CM

## 2023-06-26 DIAGNOSIS — D573 Sickle-cell trait: Secondary | ICD-10-CM | POA: Diagnosis not present

## 2023-06-26 DIAGNOSIS — Z363 Encounter for antenatal screening for malformations: Secondary | ICD-10-CM | POA: Diagnosis not present

## 2023-06-26 DIAGNOSIS — O358XX0 Maternal care for other (suspected) fetal abnormality and damage, not applicable or unspecified: Secondary | ICD-10-CM | POA: Diagnosis not present

## 2023-06-26 DIAGNOSIS — Z3A19 19 weeks gestation of pregnancy: Secondary | ICD-10-CM

## 2023-06-26 DIAGNOSIS — O26892 Other specified pregnancy related conditions, second trimester: Secondary | ICD-10-CM | POA: Insufficient documentation

## 2023-06-26 DIAGNOSIS — Z3402 Encounter for supervision of normal first pregnancy, second trimester: Secondary | ICD-10-CM

## 2023-06-26 DIAGNOSIS — O35GXX0 Maternal care for other (suspected) fetal abnormality and damage, fetal upper extremities anomalies, not applicable or unspecified: Secondary | ICD-10-CM

## 2023-06-26 DIAGNOSIS — Z3A20 20 weeks gestation of pregnancy: Secondary | ICD-10-CM | POA: Insufficient documentation

## 2023-06-26 DIAGNOSIS — Z3401 Encounter for supervision of normal first pregnancy, first trimester: Secondary | ICD-10-CM

## 2023-06-26 NOTE — Progress Notes (Signed)
  Maternal-Fetal Medicine Consultation I had the pleasure of seeing Ms. Ashley [facility]. She is G1 P0 at 20w 2d gestation and is here for fetal anatomy scan.  She was accompanied by her father. She has hemoglobin C disease. She has increased risk for spinal muscular atrophy.  On cell-free fetal DNA screening, the risks of fetal aneuploidies are not increased.  Ultrasound We performed a fetal anatomical survey.  Amniotic fluid is normal good fetal activity seen.  Fetal biometry is consistent with the previously established dates.  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  No other markers of aneuploidies or fetal structural defects are seen. Patient understands the limitations of ultrasound in detecting fetal anomalies.  Our concerns include: Polydactyly Polydactyly can be isolated or associated with other chromosomal anomalies or genetic syndromes.  In the absence of other anomalies, it is more likely to be isolated.  It is familial and more commonly seen in African-American populations. I explained that ultrasound has limitations in accurately diagnosing polydactyly and it may be evident only after birth.  Hemoglobin C disease She is homozygous Hb CC disease.  This this is usually mild and is not associated with adverse pregnancy outcomes.  Most patients have mild hemolytic anemia.  Patient has not had any pain crisis. I discussed the genetics and strongly recommended partner screening.  Partner has got hemoglobin S trait, the fetuses got a 50% chance of developing hemoglobin [place] disease.  Hemoglobin [place] disease can be associated with maternal pain crisis and fetal growth restriction, and will have to be monitored closely during pregnancy. If her partner is unwilling to screen, amniocentesis is an option for prenatal diagnosis.  I explained amniocentesis procedure and possible complication of miscarriage (1 and 500 procedures).  I recommended genetic  counseling to also discuss increased carrier risk of spinal muscular atrophy.  Patient agreed to have genetic counseling, and preferred web counseling. Alternatively, sickle-cell screening of the fetus is possible with [facility].  Recommendations -An appointment was made for her to return in 4 weeks for fetal growth assessment and evaluation of hand. -Fetal growth assessment at [redacted] weeks gestation. -Genetic counseling on 06/29/23   Thank you for consultation.  If you have any questions or concerns, please contact me the [facility].  Consultation including face-to-face (more than 50%) counseling 30 minutes.

## 2023-06-29 ENCOUNTER — Ambulatory Visit: Payer: Medicaid Other | Attending: Obstetrics and Gynecology

## 2023-06-29 DIAGNOSIS — D582 Other hemoglobinopathies: Secondary | ICD-10-CM | POA: Diagnosis not present

## 2023-06-29 DIAGNOSIS — Z3A20 20 weeks gestation of pregnancy: Secondary | ICD-10-CM | POA: Diagnosis not present

## 2023-06-29 DIAGNOSIS — O99012 Anemia complicating pregnancy, second trimester: Secondary | ICD-10-CM | POA: Diagnosis not present

## 2023-06-29 DIAGNOSIS — O285 Abnormal chromosomal and genetic finding on antenatal screening of mother: Secondary | ICD-10-CM

## 2023-06-29 NOTE — Progress Notes (Signed)
 [facility] at [facility] [address] Phone:  [phone]   Fax:  [phone]      Virtual Visit via Video Note  I connected with Ashley Hawkins on 06/29/23 at 10:30 AM EST by a video enabled telemedicine application and verified that I am speaking with the correct person using two identifiers.  Location: Patient: Home. Provider: [facility].   I discussed the limitations of evaluation and management by telemedicine and the availability of in person appointments. The patient expressed understanding and agreed to proceed.  Genetic Counseling Clinic Note:   I spoke with 23 y.o. Ashley Hawkins today to discuss her carrier screening results. She was referred by Emilio Husk, CNM.   Pregnancy History:    G1P0. EGA: [redacted]w[redacted]d by LMP. EDD: 11/11/2023. Denies personal health concerns. Denies bleeding, infections, and fevers in this pregnancy. Denies using tobacco, alcohol, or street drugs in this pregnancy.   Family History:    A three-generation pedigree was created and scanned into Epic under the Media tab.  Wilbur reports that her parents may have sickle cell trait. We discussed that it is expected that they are carriers for hemoglobin C disease, as she is affected (Hb CC). We reviewed that the gene involving hemoglobin S (sickle) is the same for hemoglobin C, and the conditions can sometimes sound similar. However, we cannot rule out other hemoglobinopathies in the parents.  Without review of reports it is possible that another hemoglobinopathy may be present and not included on the currently available carrier screening. Ashley Hawkins is encouraged to reach out with clarification or test reports if they become available.  Ashley Hawkins has limited family history information for FOB. They are separated.  Ashley Hawkins reports no known personal or family history of polydactyly.  Maternal ethnicity reported as Black and  paternal ethnicity reported as Black. Denies Ashkenazi Jewish ancestry.  Family history not remarkable for consanguinity, individuals with birth defects, intellectual disability, autism spectrum disorder, multiple spontaneous abortions, still births, or unexplained neonatal death.   Maternal Hemoglobin C Disease and Increased Carrier Risk for SMA:  We reviewed beta globin genes, SMN1 genes, hemoglobin, SMN protein, forms of beta-hemoglobinopathies, spinal muscular atrophy, natural histories of these conditions, and the autosomal mode of inheritance.   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 Therefore, she has Hb C/C. Please see report for details.   Anushri will pass down the altered beta globin gene that produces hemoglobin C (Hb C/C). There would be a 50% risk for the pregnancy to be affected with hemoglobin C disease (HbC/C) if Deandria's reproductive partner is a carrier. There are several types of beta-hemoglobinopathies, including hemoglobin S, hemoglobin D, hemoglobin E, and beta-thalassemia. If Mikaya's reproductive partner is found to be a carrier for for a beta-hemoglobinopathy, there would be a 50% chance that this pregnancy would be affected. The type of beta-hemoglobinopathy and symptoms will vary depending on the genotype. We discussed that hemoglobin sickle-C disease is a milder form of sickle cell disease that would may require treatment after birth.  Increased Risk to be a Silent Carrier for Spinal Muscular Atrophy (SMA): Nashla screened to have two functional copies of the SMN1 gene; however, due to limitations of genetic screening the laboratory cannot confirm if Korbin's two functional copies are on the same  chromosome or on opposite chromosomes. The laboratory identified the variant [id number] in Ashley Hawkins 's screening, meaning there  is increased risk for Ashley Hawkins 's two copies of the SMN1 gene to be on the same chromosome. Specifically, given Lakendra's ethnic background, her risk to be a silent carrier is approximately 1 in 34 (~3%). Please see report for details.  We reviewed the natural history of SMA, the SMN1 genes, and the autosomal recessive mode of inheritance of the condition. We reviewed that if Antigone was a silent SMA carrier, she would either pass down the chromosome with both copies of the SMN1 gene OR the chromosome with the deletions. If Ashley Hawkins 's reproductive partner is found to also be a carrier for SMA, there would be a 25% risk for the current pregnancy and all future pregnancies the couple has together to be affected (have zero functional copies of the SMN1 gene).  FOB carrier screening offered: Given Ashley Hawkins's carrier screening results, we discussed and offered carrier screening for her reproductive partner Ashley Hawkins. We reviewed the benefits and limitations of carrier screening and that it can detect most but not all carriers. We discussed that if both members of a couple are carriers for the same condition, prenatal diagnostic testing is avaliable to determine if the pregnancy is affected. We briefly reviewed the benefits and limitations of diagnostic testing including the 1 in 500 increased risk for miscarriage from the procedure. If diagnostic testing via amniocentesis is not desired, beta-hemoglobinopathy and SMA testing can be completed after birth and is included in [place] 's Newborn Screening Program.   She declined at this time, as he is not available for testing. We discussed other options, including prenatal diagnosis through amniocentesis, UNITY single gene NIPS, and postnatal testing. The benefits, risks, and limitations of each were reviewed. Ashley Hawkins declined testing at this time and wanted to think more about the available options. She is encouraged to call us  when she makes a decision before her  upcoming appointment. Alternatively, she can inform us  of her decision during the appointment.  In the meantime, we calculated the chance that this pregnancy would be affected with a beta-hemoglobinopathy or SMA. Given Nico's carrier screening results and FOB's ethnicity, there is a 1 in 16 (~6%) chance that the pregnancy would be affected with a beta-hemoglobinopathy and 1 in 8,976 (0.01%) to be affected with SMA.   Of note, Josepha was not found to be a carrier for the other two conditions screened for. This significantly reduces but does not eliminate the chance of being a carrier. Please see report for details.  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 Please see ultrasound report for details. We reviewed that isolated postaxial polydactyly is typically not associated with other health concerns. It can appear in an autosomal dominant pattern in families with incomplete penetrance. It is also more common in African populations. Edin shared that she does not know of a personal or family history of polydactyly. We reviewed that it can also be associated with chromosomal or genetic syndromes. These conditions may not be entirely evaluated prenatally and may require further postnatal evaluation. We discussed that genetic testing can be offered through prenatal diagnosis.  Previous Testing Completed:  Low risk NIPS: Redonna previously completed noninvasive prenatal screening (NIPS) in this pregnancy. The result is low risk, consistent with a female fetus. This screening significantly reduces but does not eliminate the chance that the current pregnancy has Down syndrome (trisomy 34), trisomy 86, trisomy 65, common sex chromosome  conditions, and 22q11.2 microdeletion syndrome. Please see report for details. There are many genetic conditions that cannot be detected by NIPS.   Negative ms-AFP screening: Sueellen previously completed a maternal serum AFP  screen in this pregnancy. The result is screen negative. Please see report for details. A negative result reduces the risk that the current pregnancy has an open neural tube defect. Closed neural tube defects and some open defects may not be detected by this screen.   Plan of Care:   Valree declined FOB carrier screening, UNITY single gene NIPS, and amniocentesis at this time. She would like to think of the options and will inform us  of her decision either before or during her next appointment on 07/25/2023. Routine prenatal care.   Informed consent was obtained. All questions were answered.   95 minutes were spent on the date of the encounter in service to the patient including preparation, consultation through video chat, discussion of test reports and available next steps, pedigree construction, genetic risk assessment, documentation, and care coordination.    Thank you for sharing in the care of Kourtnee with us .  Please do not hesitate to contact us  at [phone] if you have any questions.   Lauraine Bodily, MS Genetic Counselor   Genetic counseling student involved in appointment: No.

## 2023-07-10 ENCOUNTER — Ambulatory Visit: Payer: Self-pay

## 2023-07-22 NOTE — Progress Notes (Unsigned)
   PRENATAL VISIT NOTE  Subjective:  Ashley Hawkins is a 23 y.o. G1P0000 at [redacted]w[redacted]d being seen today for ongoing prenatal care.  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  Patient reports no concerns. Denies leaking of fluid, vaginal bleeding, contractions. Endorses fetal movement.   The following portions of the patient's history were reviewed and updated as appropriate: allergies, current medications, past family history, past medical history, past social history, past surgical history and problem list.   Objective:   Vitals:   07/23/23 1038  BP: 111/65  Pulse: 76  Weight: 153 lb (69.4 kg)    Fetal Status: Fetal Heart Rate (bpm): 142   Movement: Present     General:  Alert, oriented and cooperative. Patient is in no acute distress.  Skin: Skin is warm and dry. No rash noted.   Cardiovascular: Normal heart rate noted  Respiratory: Normal respiratory effort, no problems with respiration noted  Abdomen: Soft, gravid, appropriate for gestational age.  Pain/Pressure: Absent     Pelvic: Cervical exam deferred        Extremities: Normal range of motion.  Edema: Trace (Feet)  Mental Status: Normal mood and affect. Normal behavior. Normal judgment and thought content.   Assessment and Plan:  Pregnancy: G1P0000 at [redacted]w[redacted]d 1. Encounter for supervision of normal first pregnancy in second trimester (Primary) Patient doing well, feeling regular fetal movement  BP, FHR, FH appropriate   2. [redacted] weeks gestation of pregnancy Anticipatory guidance about next visits/weeks of pregnancy given.   Preterm labor symptoms and general obstetric precautions including but not limited to vaginal  bleeding, contractions, leaking of fluid and fetal movement were reviewed in detail with the patient. Please refer to After Visit Summary for other counseling recommendations.   Return in about 4 weeks (around 08/20/2023) for LOB+GTT.  Future Appointments  Date Time Provider Department Center  07/25/2023  8:30 AM [facility] US4 [facility]  08/20/2023  8:45 AM [facility] None  08/20/2023  9:35 AM Ralene Muskrat, PA-C [facility] None    Ralene Muskrat, [place]

## 2023-07-23 ENCOUNTER — Ambulatory Visit (INDEPENDENT_AMBULATORY_CARE_PROVIDER_SITE_OTHER): Payer: Medicaid Other | Admitting: Physician Assistant

## 2023-07-23 ENCOUNTER — Encounter: Payer: Self-pay | Admitting: Physician Assistant

## 2023-07-23 VITALS — BP 111/65 | HR 76 | Wt 153.0 lb

## 2023-07-23 DIAGNOSIS — Z3A24 24 weeks gestation of pregnancy: Secondary | ICD-10-CM | POA: Diagnosis not present

## 2023-07-23 DIAGNOSIS — D582 Other hemoglobinopathies: Secondary | ICD-10-CM | POA: Diagnosis not present

## 2023-07-23 DIAGNOSIS — Z3402 Encounter for supervision of normal first pregnancy, second trimester: Secondary | ICD-10-CM

## 2023-07-23 NOTE — Progress Notes (Signed)
 Pt presents for rob. Pt has no questions or concerns at this time.

## 2023-07-25 ENCOUNTER — Ambulatory Visit: Payer: Medicaid Other | Attending: Obstetrics and Gynecology

## 2023-07-25 ENCOUNTER — Other Ambulatory Visit: Payer: Self-pay | Admitting: *Deleted

## 2023-07-25 ENCOUNTER — Other Ambulatory Visit: Payer: Self-pay

## 2023-07-25 DIAGNOSIS — D582 Other hemoglobinopathies: Secondary | ICD-10-CM | POA: Insufficient documentation

## 2023-07-25 DIAGNOSIS — D573 Sickle-cell trait: Secondary | ICD-10-CM | POA: Diagnosis not present

## 2023-07-25 DIAGNOSIS — Z3A24 24 weeks gestation of pregnancy: Secondary | ICD-10-CM | POA: Diagnosis not present

## 2023-07-25 DIAGNOSIS — O99012 Anemia complicating pregnancy, second trimester: Secondary | ICD-10-CM

## 2023-07-25 DIAGNOSIS — Z148 Genetic carrier of other disease: Secondary | ICD-10-CM | POA: Insufficient documentation

## 2023-07-25 DIAGNOSIS — Q699 Polydactyly, unspecified: Secondary | ICD-10-CM

## 2023-08-20 ENCOUNTER — Ambulatory Visit (INDEPENDENT_AMBULATORY_CARE_PROVIDER_SITE_OTHER): Admitting: Obstetrics

## 2023-08-20 ENCOUNTER — Other Ambulatory Visit

## 2023-08-20 ENCOUNTER — Encounter: Payer: Self-pay | Admitting: Obstetrics

## 2023-08-20 VITALS — BP 121/74 | HR 98 | Wt 153.1 lb

## 2023-08-20 DIAGNOSIS — J301 Allergic rhinitis due to pollen: Secondary | ICD-10-CM | POA: Diagnosis not present

## 2023-08-20 DIAGNOSIS — Z3A28 28 weeks gestation of pregnancy: Secondary | ICD-10-CM

## 2023-08-20 DIAGNOSIS — Z3402 Encounter for supervision of normal first pregnancy, second trimester: Secondary | ICD-10-CM

## 2023-08-20 DIAGNOSIS — Z3493 Encounter for supervision of normal pregnancy, unspecified, third trimester: Secondary | ICD-10-CM | POA: Diagnosis not present

## 2023-08-20 MED ORDER — LORATADINE 10 MG PO TABS: 10.0000 mg | ORAL_TABLET | Freq: Every day | ORAL | 11 refills | Status: AC

## 2023-08-20 NOTE — Progress Notes (Signed)
 Subjective:  Ashley Hawkins is a 23 y.o. G1P0000 at [redacted]w[redacted]d being seen today for ongoing prenatal care.  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  Patient reports  allergic rhinitis .  Contractions: Irritability. Vag. Bleeding: None.  Movement: Present. Denies leaking of fluid.   The following portions of the patient's history were reviewed and updated as appropriate: allergies, current medications, past family history, past medical history, past social history, past surgical history and problem list. Problem list updated.  Objective:   Vitals:   08/20/23 0938  BP: 121/74  Pulse: 98  Weight: 153 lb 1.6 oz (69.4 kg)    Fetal Status: Fetal Heart Rate (bpm): 140   Movement: Present     General:  Alert, oriented and cooperative. Patient is in no acute distress.  Skin: Skin is warm and dry. No rash noted.   Cardiovascular: Normal heart rate noted  Respiratory: Normal respiratory effort, no problems with respiration noted  Abdomen: Soft, gravid, appropriate for gestational age. Pain/Pressure: Present     Pelvic:  Cervical exam deferred        Extremities: Normal range of motion.  Edema: None  Mental Status: Normal mood and affect. Normal behavior. Normal judgment and thought content.   Urinalysis:      Assessment and Plan:  Pregnancy: G1P0000 at [redacted]w[redacted]d  1. Encounter for supervision of normal first pregnancy in second trimester (Primary)  2. [redacted] weeks gestation of pregnancy Rx: - Glucose Tolerance, 2 Hours w/1 Hour - CBC - HIV antibody (with reflex) - RPR  3. Seasonal allergic rhinitis due to pollen Rx: - loratadine (CLARITIN) 10 MG tablet; Take 1 tablet (10 mg total) by  mouth daily.  Dispense: 30 tablet; Refill: 11    Preterm labor symptoms and general obstetric precautions including but not limited to vaginal bleeding, contractions, leaking of fluid and fetal movement were reviewed in detail with the patient. Please refer to After Visit Summary for other counseling recommendations.   Return in about 2 weeks (around 09/03/2023).   Brock Bad, MD 08/20/2023

## 2023-08-20 NOTE — Progress Notes (Signed)
 Pt presents for rob. Pt has no questions or concerns at this time.

## 2023-08-21 ENCOUNTER — Encounter: Payer: Self-pay | Admitting: Family Medicine

## 2023-08-21 ENCOUNTER — Other Ambulatory Visit: Payer: Self-pay

## 2023-08-21 DIAGNOSIS — O24419 Gestational diabetes mellitus in pregnancy, unspecified control: Secondary | ICD-10-CM | POA: Insufficient documentation

## 2023-08-21 LAB — CBC
Hematocrit: 27.7 % — ABNORMAL LOW (ref 34.0–46.6)
Hemoglobin: 9.3 g/dL — ABNORMAL LOW (ref 11.1–15.9)
MCH: 28.9 pg (ref 26.6–33.0)
MCHC: 33.6 g/dL (ref 31.5–35.7)
MCV: 86 fL (ref 79–97)
NRBC: 1 % — ABNORMAL HIGH (ref 0–0)
Platelets: 169 10*3/uL (ref 150–450)
RBC: 3.22 x10E6/uL — ABNORMAL LOW (ref 3.77–5.28)
RDW: 15.9 % — ABNORMAL HIGH (ref 11.7–15.4)
WBC: 13.4 10*3/uL — ABNORMAL HIGH (ref 3.4–10.8)

## 2023-08-21 LAB — RPR: RPR Ser Ql: NONREACTIVE

## 2023-08-21 LAB — GLUCOSE TOLERANCE, 2 HOURS W/ 1HR
Glucose, 1 hour: 185 mg/dL — ABNORMAL HIGH (ref 70–179)
Glucose, 2 hour: 107 mg/dL (ref 70–152)
Glucose, Fasting: 75 mg/dL (ref 70–91)

## 2023-08-21 LAB — HIV ANTIBODY (ROUTINE TESTING W REFLEX): HIV Screen 4th Generation wRfx: NONREACTIVE

## 2023-08-21 MED ORDER — ACCU-CHEK GUIDE TEST VI STRP: ORAL_STRIP | 12 refills | Status: DC

## 2023-08-21 MED ORDER — ACCU-CHEK SOFTCLIX LANCETS MISC: 12 refills | Status: DC

## 2023-08-21 MED ORDER — ACCU-CHEK GUIDE W/DEVICE KIT: 1.0000 | PACK | Freq: Four times a day (QID) | 0 refills | Status: DC

## 2023-08-21 NOTE — Progress Notes (Signed)
 Diabetic supplies and referral sent

## 2023-09-03 ENCOUNTER — Ambulatory Visit: Admitting: Advanced Practice Midwife

## 2023-09-03 VITALS — BP 120/69 | HR 70 | Wt 155.9 lb

## 2023-09-03 DIAGNOSIS — Z3402 Encounter for supervision of normal first pregnancy, second trimester: Secondary | ICD-10-CM

## 2023-09-03 DIAGNOSIS — O24419 Gestational diabetes mellitus in pregnancy, unspecified control: Secondary | ICD-10-CM

## 2023-09-03 NOTE — Progress Notes (Signed)
 Pt presents for rob. Pt would like the t-dap

## 2023-09-03 NOTE — Progress Notes (Signed)
   PRENATAL VISIT NOTE  Subjective:  Ashley Hawkins is a 23 y.o. G1P0000 at [redacted]w[redacted]d being seen today for ongoing prenatal care.  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  Patient reports occasional contractions.  Contractions: Irritability. Vag. Bleeding: None.  Movement: Present. Denies leaking of fluid.   The following portions of the patient's history were reviewed and updated as appropriate: allergies, current medications, past family history, past medical history, past social history, past surgical history and problem list.   Objective:   Vitals:   09/03/23 1028  BP: 120/69  Pulse: 70  Weight: 155 lb 14.4 oz (70.7 kg)    Fetal Status: Fetal Heart Rate (bpm): 132 Fundal Height: 30 cm Movement: Present     General:  Alert, oriented and cooperative. Patient is in no acute distress.  Skin: Skin is warm and dry. No rash noted.   Cardiovascular: Normal heart rate noted  Respiratory: Normal respiratory effort, no problems with respiration noted  Abdomen: Soft, gravid, appropriate for gestational age.  Pain/Pressure: Present     Pelvic: Cervical exam deferred        Extremities: Normal range of motion.  Edema: None  Mental Status: Normal mood and affect. Normal behavior. Normal judgment and thought content.   Assessment and Plan:  Pregnancy: G1P0000 at [redacted]w[redacted]d 1. Encounter for supervision of low-risk first pregnancy in second trimester (Primary) --Anticipatory guidance about next visits/weeks of pregnancy given.   2. Gestational diabetes mellitus (GDM) in third trimester, gestational diabetes method of control unspecified --Pt has diabetes educator appt on  Wednesday and plans to obtain the meter there.  Pt to contact office if she needs Rx for meter. --Reviewed ADA diet, recommendations to increase protein and vegetables, reduce added sugar.   Preterm labor symptoms and general obstetric precautions including but not limited to vaginal bleeding, contractions, leaking of fluid and fetal movement were reviewed in detail with the patient. Please refer to After Visit Summary for other counseling recommendations.   Return in about 2 weeks (around 09/17/2023) for LOB.  Future Appointments  Date Time Provider Department Center  09/05/2023  9:00 AM [facility] GDM CLASS [facility] NDM  09/17/2023  1:50 PM Leftwich-Kirby, Darren Em, CNM [facility] None  09/19/2023 11:30 AM [facility] US3 [facility]    Arlester Bence, CNM

## 2023-09-05 ENCOUNTER — Ambulatory Visit

## 2023-09-05 NOTE — Progress Notes (Deleted)
 Patient was seen on 09/05/2023 for Gestational Diabetes self-management class at the [facility]. The following learning objectives were met by the patient during this course:  States the definition of Gestational Diabetes States why dietary management is important in controlling blood glucose Describes the effects each nutrient has on blood glucose levels Demonstrates ability to create a balanced meal plan Demonstrates carbohydrate counting  States when to check blood glucose levels Demonstrates proper blood glucose monitoring techniques States the effect of stress and exercise on blood glucose levels States the importance of limiting caffeine and abstaining from alcohol and smoking  Blood glucose monitor given: *** Lot # *** Exp: *** Blood glucose reading: ***  *** Patient has a meter prior to visit. Patient is *** testing pre breakfast and 2 hours after each meal. FBS: *** Postprandial: *** Blood glucose today in class ***  Patient instructed to monitor glucose levels: QID FBS: 60 - <90 1 hour: <140 2 hour: <120  *Patient received handouts: Nutrition Diabetes and Pregnancy Carbohydrate Counting List Blood glucose log Snack ideas for diabetes during pregnancy  Patient will be seen for follow-up as needed.

## 2023-09-10 NOTE — Progress Notes (Unsigned)
 Patient was seen on 09/12/2023 for Gestational Diabetes self-management class at the [facility]. The following learning objectives were met by the patient during this course:  States the definition of Gestational Diabetes States why dietary management is important in controlling blood glucose Describes the effects each nutrient has on blood glucose levels Demonstrates ability to create a balanced meal plan Demonstrates carbohydrate counting  States when to check blood glucose levels Demonstrates proper blood glucose monitoring techniques States the effect of stress and exercise on blood glucose levels States the importance of limiting caffeine and abstaining from alcohol and smoking  Blood glucose monitor given: Accu Chek Guide me Lot # [id number] Exp: 05/08/2024 Blood glucose reading in class today: 85 mg/dL, reported as pre meal per Pt    Patient instructed to monitor glucose levels: QID FBS: 60 - <90 1 hour: <140 2 hour: <120  *Patient received handouts: Nutrition Diabetes and Pregnancy Carbohydrate Counting List Blood glucose log Snack ideas for diabetes during pregnancy  Patient will be seen for follow-up as needed.

## 2023-09-12 ENCOUNTER — Encounter: Attending: Obstetrics and Gynecology | Admitting: Dietician

## 2023-09-12 DIAGNOSIS — O24419 Gestational diabetes mellitus in pregnancy, unspecified control: Secondary | ICD-10-CM | POA: Diagnosis not present

## 2023-09-17 ENCOUNTER — Ambulatory Visit (INDEPENDENT_AMBULATORY_CARE_PROVIDER_SITE_OTHER): Admitting: Advanced Practice Midwife

## 2023-09-17 VITALS — BP 112/76 | HR 80 | Wt 156.2 lb

## 2023-09-17 DIAGNOSIS — Z3403 Encounter for supervision of normal first pregnancy, third trimester: Secondary | ICD-10-CM

## 2023-09-17 DIAGNOSIS — Z3A32 32 weeks gestation of pregnancy: Secondary | ICD-10-CM | POA: Diagnosis not present

## 2023-09-17 NOTE — Progress Notes (Signed)
   PRENATAL VISIT NOTE  Subjective:  Ashley Hawkins is a 23 y.o. G1P0000 at [redacted]w[redacted]d being seen today for ongoing prenatal care.  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  Patient reports  no complaints .  Contractions: Irritability. Vag. Bleeding: None.  Movement: Present. Denies leaking of fluid.   The following portions of the patient's history were reviewed and updated as appropriate: allergies, current medications, past family history, past medical history, past social history, past surgical history and problem list.   Objective:   Vitals:   09/17/23 1349  BP: 112/76  Pulse: 80  Weight: 156 lb 3.2 oz (70.9 kg)    Fetal Status: Fetal Heart Rate (bpm): 138 Fundal Height: 31 cm Movement: Present     General:  Alert, oriented and cooperative. Patient is in no acute distress.  Skin: Skin is warm and dry. No rash noted.   Cardiovascular: Normal heart rate noted  Respiratory: Normal respiratory effort, no problems with respiration noted  Abdomen: Soft, gravid, appropriate for gestational age.  Pain/Pressure: Present     Pelvic: Cervical exam deferred        Extremities: Normal range of motion.  Edema: None  Mental Status: Normal mood and affect. Normal behavior. Normal judgment and thought content.   Assessment and Plan:  Pregnancy: G1P0000 at [redacted]w[redacted]d 1. Encounter for supervision of normal first pregnancy in third trimester --Anticipatory guidance about next visits/weeks of pregnancy given.   2. [redacted] weeks gestation of pregnancy (Primary)    Term labor symptoms and general obstetric precautions including but not limited to vaginal bleeding, contractions,  leaking of fluid and fetal movement were reviewed in detail with the patient. Please refer to After Visit Summary for other counseling recommendations.   No follow-ups on file.  Future Appointments  Date Time Provider Department Center  09/19/2023 11:30 AM [facility]  10/01/2023  9:55 AM Leftwich-Kirby, Darren Em, CNM [facility] None    Arlester Bence, CNM

## 2023-09-17 NOTE — Progress Notes (Signed)
 Pt presents for rob. Pt has no questions or concerns at this time.

## 2023-09-19 ENCOUNTER — Ambulatory Visit: Attending: Obstetrics and Gynecology

## 2023-09-19 ENCOUNTER — Ambulatory Visit (HOSPITAL_BASED_OUTPATIENT_CLINIC_OR_DEPARTMENT_OTHER): Admitting: Maternal & Fetal Medicine

## 2023-09-19 ENCOUNTER — Other Ambulatory Visit: Payer: Self-pay | Admitting: *Deleted

## 2023-09-19 DIAGNOSIS — Q699 Polydactyly, unspecified: Secondary | ICD-10-CM | POA: Insufficient documentation

## 2023-09-19 DIAGNOSIS — D582 Other hemoglobinopathies: Secondary | ICD-10-CM | POA: Diagnosis not present

## 2023-09-19 DIAGNOSIS — O35GXX0 Maternal care for other (suspected) fetal abnormality and damage, fetal upper extremities anomalies, not applicable or unspecified: Secondary | ICD-10-CM

## 2023-09-19 DIAGNOSIS — Z148 Genetic carrier of other disease: Secondary | ICD-10-CM

## 2023-09-19 DIAGNOSIS — Z3A32 32 weeks gestation of pregnancy: Secondary | ICD-10-CM

## 2023-09-19 DIAGNOSIS — O24419 Gestational diabetes mellitus in pregnancy, unspecified control: Secondary | ICD-10-CM

## 2023-09-19 NOTE — Progress Notes (Signed)
 After review, MFM consult with provider is not indicated for today  Ashley Bowling, DO 09/19/2023 12:01 PM  [facility]

## 2023-10-01 ENCOUNTER — Ambulatory Visit: Admitting: Advanced Practice Midwife

## 2023-10-01 VITALS — BP 120/77 | HR 81 | Wt 158.0 lb

## 2023-10-01 DIAGNOSIS — O24419 Gestational diabetes mellitus in pregnancy, unspecified control: Secondary | ICD-10-CM

## 2023-10-01 DIAGNOSIS — Z148 Genetic carrier of other disease: Secondary | ICD-10-CM

## 2023-10-01 DIAGNOSIS — D573 Sickle-cell trait: Secondary | ICD-10-CM | POA: Diagnosis not present

## 2023-10-01 DIAGNOSIS — Z3A34 34 weeks gestation of pregnancy: Secondary | ICD-10-CM | POA: Diagnosis not present

## 2023-10-01 DIAGNOSIS — Z3403 Encounter for supervision of normal first pregnancy, third trimester: Secondary | ICD-10-CM

## 2023-10-01 NOTE — Progress Notes (Unsigned)
   PRENATAL VISIT NOTE  Subjective:  Ashley Hawkins is a 23 y.o. G1P0000 at [redacted]w[redacted]d being seen today for ongoing prenatal care.  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  Patient reports {sx:14538}.  Contractions: Not present. Vag. Bleeding: None.  Movement: Present. Denies leaking of fluid.   The following portions of the patient's history were reviewed and updated as appropriate: allergies, current medications, past family history, past medical history, past social history, past surgical history and problem list.   Objective:   Vitals:   10/01/23 1000  BP: 120/77  Pulse: 81  Weight: 158 lb (71.7 kg)     Fetal Status: Fetal Heart Rate (bpm): 135   Movement: Present      General: Alert, oriented and cooperative. Patient is in no acute distress.  Skin: Skin is warm and dry. No rash noted.   Cardiovascular: Normal heart rate noted  Respiratory: Normal respiratory effort, no problems with respiration noted  Abdomen: Soft, gravid, appropriate for gestational age.  Pain/Pressure: Absent     Pelvic: {Blank single:19197::"Cervical exam performed in the presence of a chaperone","Cervical exam deferred"}        Extremities: Normal range of motion.  Edema: None  Mental Status: Normal mood and affect. Normal behavior. Normal judgment and thought content.   Assessment and Plan:  Pregnancy: G1P0000 at [redacted]w[redacted]d 1. Encounter for supervision of normal first pregnancy in third trimester (Primary) --Anticipatory guidance about next visits/weeks of pregnancy given.  --Questions answered about physiologic changes of pregnancy, like sleep changes, acid reflux.  Pt does not  desires any treatments for these and is managing well.   2. [redacted] weeks gestation of pregnancy   3. Gestational diabetes mellitus (GDM) in third trimester, gestational diabetes method of control unspecified ***  4. Sickle cell trait (HCC) --Had counseling, declined partner test/amnio  5. Carrier of spinal muscular atrophy -See above  {Blank single:19197::"Term","Preterm"} labor symptoms and general obstetric precautions including but not limited to vaginal bleeding, contractions, leaking of fluid and fetal movement were reviewed in detail with the patient. Please refer to After Visit Summary for other counseling recommendations.   No follow-ups on file.  Future Appointments  Date Time Provider Department Center  10/16/2023 10:35 AM Tari Fare, CNM [facility] None  10/25/2023 11:00 AM [facility] PROVIDER 1 [facility]  10/25/2023 11:30 AM [facility] US5 [facility]    Arlester Bence, CNM

## 2023-10-01 NOTE — Progress Notes (Unsigned)
 Denies problems today. Glucose this am was 85.

## 2023-10-16 ENCOUNTER — Other Ambulatory Visit (HOSPITAL_COMMUNITY)
Admission: RE | Admit: 2023-10-16 | Discharge: 2023-10-16 | Disposition: A | Discharge status: Home / Self Care | Source: Ambulatory Visit | Attending: Certified Nurse Midwife | Admitting: Certified Nurse Midwife

## 2023-10-16 ENCOUNTER — Ambulatory Visit: Admitting: Certified Nurse Midwife

## 2023-10-16 VITALS — BP 118/82 | HR 62 | Wt 163.0 lb

## 2023-10-16 DIAGNOSIS — Z3A36 36 weeks gestation of pregnancy: Secondary | ICD-10-CM | POA: Diagnosis not present

## 2023-10-16 DIAGNOSIS — Z113 Encounter for screening for infections with a predominantly sexual mode of transmission: Secondary | ICD-10-CM | POA: Insufficient documentation

## 2023-10-16 DIAGNOSIS — O0993 Supervision of high risk pregnancy, unspecified, third trimester: Secondary | ICD-10-CM

## 2023-10-16 DIAGNOSIS — O24410 Gestational diabetes mellitus in pregnancy, diet controlled: Secondary | ICD-10-CM

## 2023-10-16 DIAGNOSIS — M549 Dorsalgia, unspecified: Secondary | ICD-10-CM | POA: Diagnosis not present

## 2023-10-16 DIAGNOSIS — Z3493 Encounter for supervision of normal pregnancy, unspecified, third trimester: Secondary | ICD-10-CM | POA: Diagnosis not present

## 2023-10-16 NOTE — Progress Notes (Signed)
   PRENATAL VISIT NOTE  Subjective:  Ashley Hawkins is a 23 y.o. G1P0000 at [redacted]w[redacted]d being seen today for ongoing prenatal care.  She is currently monitored for the following issues for this high-risk pregnancy and has Sickle cell trait (HCC); Encounter for supervision of normal first pregnancy in first trimester; Abnormal antibody screen; Anti-D antibodies present during pregnancy; Carrier of spinal muscular atrophy-^ risk SMA; Hemoglobin C (Hb-C) (HCC); and Gestational diabetes on their problem list.  Patient reports no complaints.  Contractions: Irregular.  .  Movement: Present. Denies leaking of fluid.   The following portions of the patient's history were reviewed and updated as appropriate: allergies, current medications, past family history, past medical history, past social history, past surgical history and problem list.   Objective:    Vitals:   10/16/23 1044  BP: 118/82  Pulse: 62  Weight: 163 lb (73.9 kg)    Fetal Status:  Fetal Heart Rate (bpm): 130 Fundal Height: 34 cm Movement: Present    General: Alert, oriented and cooperative. Patient is in no acute distress.  Skin: Skin is warm and dry. No rash noted.   Cardiovascular: Normal heart rate noted  Respiratory: Normal respiratory effort, no problems with respiration noted  Abdomen: Soft, gravid, appropriate for gestational age.  Pain/Pressure: Present     Pelvic: Cervical exam deferred        Extremities: Normal range of motion.  Edema: None  Mental Status: Normal mood and affect. Normal behavior. Normal judgment and thought content.   Assessment and Plan:  Pregnancy: G1P0000 at [redacted]w[redacted]d 1. Supervision of high risk pregnancy in third trimester - Patient doing well.  - Reports frequent and vigorous fetal movement.   2. [redacted] weeks gestation of pregnancy (Primary) - Culture, beta strep (group b only) - Cervicovaginal ancillary only( [place])  3. Diet controlled gestational diabetes mellitus (GDM) in third  trimester - Finger stick on 10/08/2023. Result 85  - encouraged the use of a Glucose log for numbers .  4. Acute midline back pain, unspecified back location - Recommended the use of maternity support belt.  - Reviewed labor expectations and when to report to the hospital.   Term labor symptoms and general obstetric precautions including but not limited to vaginal bleeding, contractions, leaking of fluid and fetal movement were reviewed in detail with the patient. Please refer to After Visit Summary for other counseling recommendations.   Return in about 1 week (around 10/23/2023) for HROB.  Future Appointments  Date Time Provider Department Center  10/25/2023 11:00 AM [facility] PROVIDER 1 [facility]  10/25/2023 11:30 AM [facility] US5 [facility]    Bobby Barton Maurie Southern) Marlys Singh, MSN, CNM  Center for [facility]  10/16/2023 11:34 AM

## 2023-10-16 NOTE — Progress Notes (Signed)
 Pain in lower back intermittent. States contractions off  and on as well. Finger stick on 10/08/2023. Result 85. Doing BP on [employer]. Did check %/20/2025 127/70.

## 2023-10-17 LAB — CERVICOVAGINAL ANCILLARY ONLY
Chlamydia: NEGATIVE
Comment: NEGATIVE
Comment: NORMAL
Neisseria Gonorrhea: NEGATIVE

## 2023-10-20 ENCOUNTER — Encounter (HOSPITAL_COMMUNITY): Payer: Self-pay | Admitting: Obstetrics and Gynecology

## 2023-10-20 ENCOUNTER — Other Ambulatory Visit: Payer: Self-pay

## 2023-10-20 ENCOUNTER — Inpatient Hospital Stay (HOSPITAL_COMMUNITY)
Admission: AD | Admit: 2023-10-20 | Discharge: 2023-10-21 | Disposition: A | Discharge status: Home / Self Care | Attending: Obstetrics and Gynecology | Admitting: Obstetrics and Gynecology

## 2023-10-20 DIAGNOSIS — O471 False labor at or after 37 completed weeks of gestation: Secondary | ICD-10-CM | POA: Insufficient documentation

## 2023-10-20 DIAGNOSIS — O26893 Other specified pregnancy related conditions, third trimester: Secondary | ICD-10-CM | POA: Diagnosis present

## 2023-10-20 DIAGNOSIS — Z3A37 37 weeks gestation of pregnancy: Secondary | ICD-10-CM | POA: Insufficient documentation

## 2023-10-20 DIAGNOSIS — O479 False labor, unspecified: Secondary | ICD-10-CM | POA: Diagnosis not present

## 2023-10-20 DIAGNOSIS — Z3401 Encounter for supervision of normal first pregnancy, first trimester: Secondary | ICD-10-CM

## 2023-10-20 DIAGNOSIS — O163 Unspecified maternal hypertension, third trimester: Secondary | ICD-10-CM | POA: Diagnosis not present

## 2023-10-20 DIAGNOSIS — R030 Elevated blood-pressure reading, without diagnosis of hypertension: Secondary | ICD-10-CM | POA: Diagnosis not present

## 2023-10-20 LAB — CULTURE, BETA STREP (GROUP B ONLY): Strep Gp B Culture: NEGATIVE

## 2023-10-20 LAB — URINALYSIS, ROUTINE W REFLEX MICROSCOPIC
Bilirubin Urine: NEGATIVE
Glucose, UA: NEGATIVE mg/dL
Hgb urine dipstick: NEGATIVE
Ketones, ur: NEGATIVE mg/dL
Leukocytes,Ua: NEGATIVE
Nitrite: NEGATIVE
Protein, ur: NEGATIVE mg/dL
Specific Gravity, Urine: 1.003 — ABNORMAL LOW (ref 1.005–1.030)
pH: 7 (ref 5.0–8.0)

## 2023-10-20 NOTE — MAU Provider Note (Signed)
  History     CSN: [id number]  Arrival date and time: 10/20/23 2229   Event Date/Time   First Provider Initiated Contact with Patient 10/20/23 2306      Chief Complaint  Patient presents with  . Contractions  . Vaginal Discharge   HPI  {GYN/OB [id number]}  Past Medical History:  Diagnosis Date  . Sickle cell trait (HCC) 05/06/2020    Past Surgical History:  Procedure Laterality Date  . APPENDECTOMY  2019    Family History  Problem Relation Age of Onset  . Hypertension Maternal Grandmother   . Hypertension Maternal Uncle     Social History   Tobacco Use  . Smoking status: Never    Passive exposure: Yes  . Smokeless tobacco: Never  Vaping Use  . Vaping status: Never Used  Substance Use Topics  . Alcohol use: No    Alcohol/week: 0.0 standard drinks of alcohol  . Drug use: No    Allergies:  Allergies  Allergen Reactions  . Codeine  Hives    Medications Prior to Admission  Medication Sig Dispense Refill Last Dose/Taking  . loratadine  (CLARITIN ) 10 MG tablet Take 1 tablet (10 mg total) by mouth daily. 30 tablet 11 Past Week  . Prenat-FeCbn-FeAsp-Meth-FA-DHA (PRENATE MINI ) 18-0.6-0.4-350 MG CAPS Take 1 tablet by mouth daily. 30 capsule 11 10/19/2023  . Accu-Chek Softclix Lancets lancets Use as instructed 100 each 12   . Blood Glucose Monitoring Suppl (ACCU-CHEK GUIDE) w/Device KIT 1 Device by Does not apply route 4 (four) times daily. Please check blood sugar 4 times daily. One fasting when you first wake up in the morning before eating, and two hours after each meal (breakfast, lunch, and dinner) 1 kit 0   . Blood Pressure Monitoring (BLOOD PRESSURE KIT) DEVI 1 kit by Does not apply route once a week. 1 each 0   . Doxylamine-Pyridoxine ER (BONJESTA ) 20-20 MG TBCR Take 1 tablet by mouth at bedtime. May increase to once during day if needed.  Do not take more than 2 pills per day. (Patient not taking: Reported on 10/16/2023) 60 tablet 2   . glucose blood (ACCU-CHEK  GUIDE TEST) test strip Please check blood sugar 4 times daily. One fasting when you first wake up in the morning before eating, and two hours after each meal (breakfast, lunch, and dinner) 100 each 12   . metroNIDAZOLE  (FLAGYL ) 500 MG tablet Take 1 tablet (500 mg total) by mouth 2 (two) times daily. (Patient not taking: Reported on 10/16/2023) 14 tablet 0   . Misc. Devices (GOJJI WEIGHT SCALE) MISC 1 Device by Does not apply route every 30 (thirty) days. (Patient not taking: Reported on 10/16/2023) 1 each 0     Review of Systems Physical Exam   Blood pressure 134/81, pulse 62, temperature 97.7 F (36.5 C), resp. rate 15, height 5\' 5"  (1.651 m), weight 74.8 kg, last menstrual period 02/04/2023, SpO2 100%.  Physical Exam  MAU Course  Procedures  MDM ***  Assessment and Plan  ***  Shawntia Mangal 10/20/2023, 11:06 PM

## 2023-10-20 NOTE — MAU Note (Signed)
 Ashley Hawkins is a 23 y.o. at [redacted]w[redacted]d here in MAU reporting: a white and brown vaginal discharge.  Has had ctx off and on all day. Now about 4 min apart. Good fetal movement felt.   LMP:  Onset of complaint: this afternoon Pain score: 7 Vitals:   10/20/23 2243  BP: 126/81  Pulse: 61  Resp: 18  Temp: 97.7 F (36.5 C)     FHT: pt wearing jumpsuit unable to do in triage  Lab orders placed from triage: u/a labor check

## 2023-10-21 ENCOUNTER — Inpatient Hospital Stay (HOSPITAL_COMMUNITY)
Admission: AD | Admit: 2023-10-21 | Discharge: 2023-10-21 | Disposition: A | Discharge status: Home / Self Care | Attending: Obstetrics & Gynecology | Admitting: Obstetrics & Gynecology

## 2023-10-21 DIAGNOSIS — O479 False labor, unspecified: Secondary | ICD-10-CM

## 2023-10-21 DIAGNOSIS — O471 False labor at or after 37 completed weeks of gestation: Secondary | ICD-10-CM | POA: Insufficient documentation

## 2023-10-21 DIAGNOSIS — Z3A37 37 weeks gestation of pregnancy: Secondary | ICD-10-CM

## 2023-10-21 DIAGNOSIS — O163 Unspecified maternal hypertension, third trimester: Secondary | ICD-10-CM | POA: Diagnosis not present

## 2023-10-21 LAB — COMPREHENSIVE METABOLIC PANEL WITH GFR
ALT: 11 U/L (ref 0–44)
AST: 19 U/L (ref 15–41)
Albumin: 3 g/dL — ABNORMAL LOW (ref 3.5–5.0)
Alkaline Phosphatase: 113 U/L (ref 38–126)
Anion gap: 7 (ref 5–15)
BUN: 5 mg/dL — ABNORMAL LOW (ref 6–20)
CO2: 21 mmol/L — ABNORMAL LOW (ref 22–32)
Calcium: 8.7 mg/dL — ABNORMAL LOW (ref 8.9–10.3)
Chloride: 108 mmol/L (ref 98–111)
Creatinine, Ser: 0.66 mg/dL (ref 0.44–1.00)
GFR, Estimated: >60 mL/min (ref >60)
Glucose, Bld: 82 mg/dL (ref 70–99)
Potassium: 3.2 mmol/L — ABNORMAL LOW (ref 3.5–5.1)
Sodium: 136 mmol/L (ref 135–145)
Total Bilirubin: 1.5 mg/dL — ABNORMAL HIGH (ref 0.0–1.2)
Total Protein: 6.8 g/dL (ref 6.5–8.1)

## 2023-10-21 LAB — CBC
HCT: 28.6 % — ABNORMAL LOW (ref 36.0–46.0)
Hemoglobin: 10.5 g/dL — ABNORMAL LOW (ref 12.0–15.0)
MCH: 29.8 pg (ref 26.0–34.0)
MCHC: 36.7 g/dL — ABNORMAL HIGH (ref 30.0–36.0)
MCV: 81.3 fL (ref 80.0–100.0)
Platelets: 162 10*3/uL (ref 150–400)
RBC: 3.52 MIL/uL — ABNORMAL LOW (ref 3.87–5.11)
RDW: 15.3 % (ref 11.5–15.5)
WBC: 16 10*3/uL — ABNORMAL HIGH (ref 4.0–10.5)
nRBC: 0.8 % — ABNORMAL HIGH (ref 0.0–0.2)

## 2023-10-21 LAB — PROTEIN / CREATININE RATIO, URINE
Creatinine, Urine: 40 mg/dL
Total Protein, Urine: <6 mg/dL

## 2023-10-21 MED ORDER — CYCLOBENZAPRINE HCL 5 MG PO TABS
10.0000 mg | ORAL_TABLET | Freq: Three times a day (TID) | ORAL | Status: DC | PRN
  Administered 2023-10-21: 10 mg via ORAL
  Filled 2023-10-21: qty 2

## 2023-10-21 MED ORDER — CYCLOBENZAPRINE HCL 10 MG PO TABS: 10.0000 mg | ORAL_TABLET | Freq: Three times a day (TID) | ORAL | 0 refills | Status: AC | PRN

## 2023-10-21 NOTE — MAU Note (Signed)
 Ashley Hawkins is a 23 y.o. at [redacted]w[redacted]d here in MAU reporting: was here during the night as a labor check.  BP was elevated. Pre-e labs done, all normal.  Pt here as advised for BP recheck. Still contracting, "every ", is able to talk and walk through them no bleeding or leaking.  Reports +FM. Denies HA, visual changes, epigastric pain or increased swelling.   Onset of complaint: ongoing Pain score: 7, as strong as last night, but not as often. Was able to sleep last night Vitals:   10/21/23 1243  BP: 130/83  Pulse: 72  Resp: 16  Temp: 97.8 F (36.6 C)  SpO2: 100%     FHT:144 Lab orders placed from triage:

## 2023-10-21 NOTE — MAU Provider Note (Signed)
 Event Date/Time   First Provider Initiated Contact with Patient 10/21/23 1250      S Ms. Ashley Hawkins is a 23 y.o. G1P0000 pregnant female at [redacted]w[redacted]d who presents to MAU today with complaint of being told to come in for BP recheck. Patient had several mild range pressures last night but not 4 hrs apart, PreE labs negative. Asymptomatic denying HA, vision changes, RUQ/epigastric pain, increased swelling. Pt states ctx q47mins and able to talk / walk through them.  Reportedly less than when she was seen earlier this morning. Denies VB, LOF.  Endorses +FM.   FHT 144 in triage.   [facility]ce[facility] at [facility]. Prenatal records reviewed.  Pertinent items noted in HPI and remainder of comprehensive ROS otherwise negative.   O BP 127/82 (BP Location: Right Arm)   Pulse 65   Temp 97.8 F (36.6 C) (Oral)   Resp 16   Ht 5\' 5"  (1.651 m)   Wt 74.1 kg   LMP 02/04/2023   SpO2 98%   BMI 27.19 kg/m  Physical Exam Vitals and nursing note reviewed.  Constitutional:      General: She is not in acute distress.    Appearance: Normal appearance. She is not ill-appearing.  HENT:     Head: Normocephalic and atraumatic.     Right Ear: External ear normal.     Left Ear: External ear normal.     Nose: Nose normal.     Mouth/Throat:     Mouth: Mucous membranes are moist.     Pharynx: Oropharynx is clear.  Eyes:     Extraocular Movements: Extraocular movements intact.     Conjunctiva/sclera: Conjunctivae normal.  Cardiovascular:     Rate and Rhythm: Normal rate.  Pulmonary:     Effort: Pulmonary effort is normal. No respiratory distress.  Abdominal:     General: There is no distension.     Palpations: Abdomen is soft.     Tenderness: There is no abdominal tenderness.     Comments: gravid  Genitourinary:    Comments: Patient intermittently contracting in the room but able to speak through them Musculoskeletal:        General: No swelling. Normal range of motion.     Cervical back: Normal range of  motion.  Skin:    General: Skin is warm and dry.  Neurological:     Mental Status: She is alert and oriented to person, place, and time. Mental status is at baseline.     Motor: No weakness.     Gait: Gait normal.  Psychiatric:        Mood and Affect: Mood normal.        Behavior: Behavior normal.    MDM: MAU Course:  Pt seen today for lessened ctx like activity and BP recheck.  Stable labs and vital signs.  Pt requested flexeril for the intermittent contractions.  Stable for d/c.   AP #[redacted] weeks gestation  #False labor #F/u elevated BP third trimester - flexeril TID PRN  - message to [facility] for BP check request in next 48hrs   Discharge from MAU in stable condition with strict/usual precautions Follow up at [facility] as scheduled for ongoing prenatal care  Allergies as of 10/21/2023       Reactions   Codeine  Hives        Medication List     TAKE these medications    Accu-Chek Guide Test test strip Generic drug: glucose blood Please check blood sugar 4 times daily.  One fasting when you first wake up in the morning before eating, and two hours after each meal (breakfast, lunch, and dinner)   Accu-Chek Guide w/Device Kit 1 Device by Does not apply route 4 (four) times daily. Please check blood sugar 4 times daily. One fasting when you first wake up in the morning before eating, and two hours after each meal (breakfast, lunch, and dinner)   Accu-Chek Softclix Lancets lancets Use as instructed   Blood Pressure Kit Devi 1 kit by Does not apply route once a week.   cyclobenzaprine 10 MG tablet Commonly known as: FLEXERIL Take 1 tablet (10 mg total) by mouth 3 (three) times daily as needed for muscle spasms.   Gojji Weight Scale Misc 1 Device by Does not apply route every 30 (thirty) days.   loratadine  10 MG tablet Commonly known as: Claritin  Take 1 tablet (10 mg total) by mouth daily.   Prenate Mini  18-0.6-0.4-350 MG Caps Take 1 tablet by mouth daily.         Ebony Goldstein, MD 10/21/2023 1:22 PM

## 2023-10-22 ENCOUNTER — Inpatient Hospital Stay (HOSPITAL_COMMUNITY)
Admission: AD | Admit: 2023-10-22 | Discharge: 2023-10-25 | DRG: 806 | Disposition: A | Discharge status: Home / Self Care | Payer: Self-pay | Attending: Obstetrics and Gynecology | Admitting: Obstetrics and Gynecology

## 2023-10-22 ENCOUNTER — Encounter (INDEPENDENT_AMBULATORY_CARE_PROVIDER_SITE_OTHER): Payer: Self-pay

## 2023-10-22 ENCOUNTER — Telehealth: Payer: Self-pay

## 2023-10-22 ENCOUNTER — Encounter: Payer: Self-pay | Admitting: Obstetrics and Gynecology

## 2023-10-22 ENCOUNTER — Encounter (HOSPITAL_COMMUNITY): Payer: Self-pay | Admitting: Obstetrics & Gynecology

## 2023-10-22 DIAGNOSIS — O1413 Severe pre-eclampsia, third trimester: Secondary | ICD-10-CM | POA: Diagnosis present

## 2023-10-22 DIAGNOSIS — D573 Sickle-cell trait: Secondary | ICD-10-CM | POA: Diagnosis present

## 2023-10-22 DIAGNOSIS — O1414 Severe pre-eclampsia complicating childbirth: Principal | ICD-10-CM | POA: Diagnosis present

## 2023-10-22 DIAGNOSIS — D582 Other hemoglobinopathies: Secondary | ICD-10-CM | POA: Diagnosis present

## 2023-10-22 DIAGNOSIS — Z8249 Family history of ischemic heart disease and other diseases of the circulatory system: Secondary | ICD-10-CM

## 2023-10-22 DIAGNOSIS — Z3401 Encounter for supervision of normal first pregnancy, first trimester: Secondary | ICD-10-CM

## 2023-10-22 DIAGNOSIS — O99284 Endocrine, nutritional and metabolic diseases complicating childbirth: Secondary | ICD-10-CM | POA: Diagnosis present

## 2023-10-22 DIAGNOSIS — D62 Acute posthemorrhagic anemia: Secondary | ICD-10-CM | POA: Diagnosis not present

## 2023-10-22 DIAGNOSIS — Z3A37 37 weeks gestation of pregnancy: Secondary | ICD-10-CM

## 2023-10-22 DIAGNOSIS — O360190 Maternal care for anti-D [Rh] antibodies, unspecified trimester, not applicable or unspecified: Secondary | ICD-10-CM | POA: Diagnosis present

## 2023-10-22 DIAGNOSIS — O289 Unspecified abnormal findings on antenatal screening of mother: Secondary | ICD-10-CM | POA: Diagnosis present

## 2023-10-22 DIAGNOSIS — O9081 Anemia of the puerperium: Secondary | ICD-10-CM | POA: Diagnosis not present

## 2023-10-22 DIAGNOSIS — O24419 Gestational diabetes mellitus in pregnancy, unspecified control: Secondary | ICD-10-CM | POA: Diagnosis present

## 2023-10-22 DIAGNOSIS — O24420 Gestational diabetes mellitus in childbirth, diet controlled: Secondary | ICD-10-CM | POA: Diagnosis present

## 2023-10-22 DIAGNOSIS — Z148 Genetic carrier of other disease: Secondary | ICD-10-CM

## 2023-10-22 DIAGNOSIS — Z0130 Encounter for examination of blood pressure without abnormal findings: Secondary | ICD-10-CM

## 2023-10-22 DIAGNOSIS — E876 Hypokalemia: Secondary | ICD-10-CM | POA: Diagnosis present

## 2023-10-22 DIAGNOSIS — O134 Gestational [pregnancy-induced] hypertension without significant proteinuria, complicating childbirth: Secondary | ICD-10-CM | POA: Diagnosis present

## 2023-10-22 NOTE — Progress Notes (Signed)
 Subjective:  Ashley Hawkins is a 23 y.o. female reporting for BP check virtually per MAU instructions, and since pt has an appt scheduled for tomorrow 6/3.  Hypertension ROS: taking medications as instructed, no medication side effects noted, no TIA's, no chest pain on exertion, no dyspnea on exertion, and no swelling of ankles.    Objective:  BP 131/77   LMP 02/04/2023   Appearance alert, well appearing, and in no distress. General exam BP noted to be well controlled today at home.    Assessment:   Blood Pressure well controlled.   Plan:  Current treatment plan is effective, no change in therapy.Aaron Aas

## 2023-10-22 NOTE — Telephone Encounter (Signed)
 TC to pt to do a virtual BP check before pt visit in office tomorrow. Has been in MAU past two days for BP related concerns. Sent my chart message to pt that she can reply to when she is finished with her reading. TC to pt to inform of the instructions and to just reply to my message with her reading so we can chart it.

## 2023-10-22 NOTE — MAU Note (Signed)
 MAU Labor Triage Note: [name] is a 23 y.o. at [redacted]w[redacted]d here in MAU reporting:  Contractions every: 5 minutes Onset of ctx: 9pm Pain Score: 9  Pain Location: Abdomen  ROM: intact Vaginal Bleeding: none Last SVE: fingertip Labor Pain Management Plan: Planning epidural  Fetal Movement: Reports positive FM FHT: Fetal Heart Rate Mode: External Baseline Rate (A): 135 bpm  Vitals:   10/22/23 2330 10/22/23 2331  BP:  120/81  Pulse:  65  Resp:  18  SpO2: 100%      OB Office: Faculty GBS: Negative Lab orders placed from triage: MAU Labor Eval

## 2023-10-23 ENCOUNTER — Encounter: Admitting: Advanced Practice Midwife

## 2023-10-23 ENCOUNTER — Other Ambulatory Visit: Payer: Self-pay

## 2023-10-23 ENCOUNTER — Inpatient Hospital Stay (HOSPITAL_COMMUNITY): Admitting: Anesthesiology

## 2023-10-23 ENCOUNTER — Encounter (HOSPITAL_COMMUNITY): Payer: Self-pay | Admitting: Obstetrics & Gynecology

## 2023-10-23 DIAGNOSIS — Z3A37 37 weeks gestation of pregnancy: Secondary | ICD-10-CM | POA: Diagnosis not present

## 2023-10-23 DIAGNOSIS — O9081 Anemia of the puerperium: Secondary | ICD-10-CM | POA: Diagnosis not present

## 2023-10-23 DIAGNOSIS — D573 Sickle-cell trait: Secondary | ICD-10-CM | POA: Diagnosis not present

## 2023-10-23 DIAGNOSIS — O134 Gestational [pregnancy-induced] hypertension without significant proteinuria, complicating childbirth: Secondary | ICD-10-CM | POA: Diagnosis not present

## 2023-10-23 DIAGNOSIS — E876 Hypokalemia: Secondary | ICD-10-CM | POA: Diagnosis not present

## 2023-10-23 DIAGNOSIS — D62 Acute posthemorrhagic anemia: Secondary | ICD-10-CM | POA: Diagnosis not present

## 2023-10-23 DIAGNOSIS — O24420 Gestational diabetes mellitus in childbirth, diet controlled: Secondary | ICD-10-CM | POA: Diagnosis not present

## 2023-10-23 DIAGNOSIS — O99284 Endocrine, nutritional and metabolic diseases complicating childbirth: Secondary | ICD-10-CM | POA: Diagnosis not present

## 2023-10-23 DIAGNOSIS — R030 Elevated blood-pressure reading, without diagnosis of hypertension: Secondary | ICD-10-CM | POA: Diagnosis not present

## 2023-10-23 DIAGNOSIS — O24424 Gestational diabetes mellitus in childbirth, insulin controlled: Secondary | ICD-10-CM | POA: Diagnosis not present

## 2023-10-23 DIAGNOSIS — Z148 Genetic carrier of other disease: Secondary | ICD-10-CM | POA: Diagnosis not present

## 2023-10-23 DIAGNOSIS — Z8249 Family history of ischemic heart disease and other diseases of the circulatory system: Secondary | ICD-10-CM | POA: Diagnosis not present

## 2023-10-23 DIAGNOSIS — O1414 Severe pre-eclampsia complicating childbirth: Secondary | ICD-10-CM | POA: Diagnosis not present

## 2023-10-23 LAB — COMPREHENSIVE METABOLIC PANEL WITH GFR
ALT: 11 U/L (ref 0–44)
AST: 19 U/L (ref 15–41)
Albumin: 3 g/dL — ABNORMAL LOW (ref 3.5–5.0)
Alkaline Phosphatase: 110 U/L (ref 38–126)
Anion gap: 9 (ref 5–15)
BUN: 5 mg/dL — ABNORMAL LOW (ref 6–20)
CO2: 19 mmol/L — ABNORMAL LOW (ref 22–32)
Calcium: 8.9 mg/dL (ref 8.9–10.3)
Chloride: 109 mmol/L (ref 98–111)
Creatinine, Ser: 0.66 mg/dL (ref 0.44–1.00)
GFR, Estimated: >60 mL/min (ref >60)
Glucose, Bld: 83 mg/dL (ref 70–99)
Potassium: 3.2 mmol/L — ABNORMAL LOW (ref 3.5–5.1)
Sodium: 137 mmol/L (ref 135–145)
Total Bilirubin: 1.9 mg/dL — ABNORMAL HIGH (ref 0.0–1.2)
Total Protein: 6.6 g/dL (ref 6.5–8.1)

## 2023-10-23 LAB — GLUCOSE, CAPILLARY
Glucose-Capillary: 79 mg/dL (ref 70–99)
Glucose-Capillary: 74 mg/dL (ref 70–99)
Glucose-Capillary: 66 mg/dL — ABNORMAL LOW (ref 70–99)
Glucose-Capillary: 91 mg/dL (ref 70–99)
Glucose-Capillary: 60 mg/dL — ABNORMAL LOW (ref 70–99)
Glucose-Capillary: 86 mg/dL (ref 70–99)

## 2023-10-23 LAB — CBC
HCT: 28.8 % — ABNORMAL LOW (ref 36.0–46.0)
HCT: 29.2 % — ABNORMAL LOW (ref 36.0–46.0)
Hemoglobin: 10.7 g/dL — ABNORMAL LOW (ref 12.0–15.0)
Hemoglobin: 10.9 g/dL — ABNORMAL LOW (ref 12.0–15.0)
MCH: 30.3 pg (ref 26.0–34.0)
MCH: 30.4 pg (ref 26.0–34.0)
MCHC: 37.2 g/dL — ABNORMAL HIGH (ref 30.0–36.0)
MCHC: 37.3 g/dL — ABNORMAL HIGH (ref 30.0–36.0)
MCV: 81.6 fL (ref 80.0–100.0)
MCV: 81.6 fL (ref 80.0–100.0)
Platelets: 123 10*3/uL — ABNORMAL LOW (ref 150–400)
Platelets: 158 10*3/uL (ref 150–400)
RBC: 3.53 MIL/uL — ABNORMAL LOW (ref 3.87–5.11)
RBC: 3.58 MIL/uL — ABNORMAL LOW (ref 3.87–5.11)
RDW: 15 % (ref 11.5–15.5)
RDW: 15.1 % (ref 11.5–15.5)
WBC: 16.3 10*3/uL — ABNORMAL HIGH (ref 4.0–10.5)
WBC: 19.3 10*3/uL — ABNORMAL HIGH (ref 4.0–10.5)
nRBC: 0.5 % — ABNORMAL HIGH (ref 0.0–0.2)
nRBC: 0.7 % — ABNORMAL HIGH (ref 0.0–0.2)

## 2023-10-23 LAB — RPR: RPR Ser Ql: NONREACTIVE

## 2023-10-23 LAB — PROTEIN / CREATININE RATIO, URINE
Creatinine, Urine: 34 mg/dL
Total Protein, Urine: <6 mg/dL

## 2023-10-23 LAB — TYPE AND SCREEN
ABO/RH(D): O POS
Antibody Screen: NEGATIVE

## 2023-10-23 MED ORDER — LABETALOL HCL 5 MG/ML IV SOLN
20.0000 mg | INTRAVENOUS | Status: DC | PRN
Start: 2023-10-23 — End: 2023-10-25
  Administered 2023-10-23: 20 mg via INTRAVENOUS
  Filled 2023-10-23: qty 4

## 2023-10-23 MED ORDER — IBUPROFEN 800 MG PO TABS
800.0000 mg | ORAL_TABLET | Freq: Three times a day (TID) | ORAL | Status: DC
  Administered 2023-10-23 – 2023-10-25 (×6): 800 mg via ORAL
  Filled 2023-10-23 (×6): qty 1

## 2023-10-23 MED ORDER — EPHEDRINE 5 MG/ML INJ: 10.0000 mg | INTRAVENOUS | Status: DC | PRN

## 2023-10-23 MED ORDER — POTASSIUM CHLORIDE CRYS ER 20 MEQ PO TBCR: 20.0000 meq | EXTENDED_RELEASE_TABLET | Freq: Every day | ORAL | Status: DC

## 2023-10-23 MED ORDER — LACTATED RINGERS IV SOLN
500.0000 mL | Freq: Once | INTRAVENOUS | Status: AC
  Administered 2023-10-23: 500 mL via INTRAVENOUS

## 2023-10-23 MED ORDER — OXYTOCIN-SODIUM CHLORIDE 30-0.9 UT/500ML-% IV SOLN
1.0000 m[IU]/min | INTRAVENOUS | Status: DC
  Administered 2023-10-23: 2 m[IU]/min via INTRAVENOUS
  Filled 2023-10-23: qty 500

## 2023-10-23 MED ORDER — LACTATED RINGERS IV SOLN: INTRAVENOUS | Status: AC

## 2023-10-23 MED ORDER — MISOPROSTOL 25 MCG QUARTER TABLET: 25.0000 ug | ORAL_TABLET | Freq: Once | ORAL | Status: DC

## 2023-10-23 MED ORDER — OXYCODONE HCL 5 MG PO TABS: 10.0000 mg | ORAL_TABLET | Freq: Four times a day (QID) | ORAL | Status: DC | PRN

## 2023-10-23 MED ORDER — DIPHENHYDRAMINE HCL 50 MG/ML IJ SOLN: 12.5000 mg | INTRAMUSCULAR | Status: DC | PRN

## 2023-10-23 MED ORDER — TERBUTALINE SULFATE 1 MG/ML IJ SOLN: 0.2500 mg | Freq: Once | INTRAMUSCULAR | Status: DC | PRN

## 2023-10-23 MED ORDER — HYDRALAZINE HCL 20 MG/ML IJ SOLN: 10.0000 mg | INTRAMUSCULAR | Status: DC | PRN

## 2023-10-23 MED ORDER — ZOLPIDEM TARTRATE 5 MG PO TABS
5.0000 mg | ORAL_TABLET | Freq: Every evening | ORAL | Status: DC | PRN
Start: 2023-10-23 — End: 2023-10-25

## 2023-10-23 MED ORDER — ONDANSETRON HCL 4 MG/2ML IJ SOLN: 4.0000 mg | INTRAMUSCULAR | Status: DC | PRN

## 2023-10-23 MED ORDER — FENTANYL-BUPIVACAINE-NACL 0.5-0.125-0.9 MG/250ML-% EP SOLN
12.0000 mL/h | EPIDURAL | Status: DC | PRN
  Administered 2023-10-23: 12 mL/h via EPIDURAL
  Filled 2023-10-23: qty 250

## 2023-10-23 MED ORDER — OXYCODONE HCL 5 MG PO TABS
5.0000 mg | ORAL_TABLET | Freq: Four times a day (QID) | ORAL | Status: DC | PRN
  Administered 2023-10-24: 5 mg via ORAL
  Filled 2023-10-23 (×2): qty 1

## 2023-10-23 MED ORDER — LACTATED RINGERS IV SOLN: 500.0000 mL | INTRAVENOUS | Status: DC | PRN

## 2023-10-23 MED ORDER — PRENATAL MULTIVITAMIN CH
1.0000 | ORAL_TABLET | Freq: Every day | ORAL | Status: DC
  Administered 2023-10-24 – 2023-10-25 (×2): 1 via ORAL
  Filled 2023-10-23 (×2): qty 1

## 2023-10-23 MED ORDER — FENTANYL CITRATE (PF) 100 MCG/2ML IJ SOLN
50.0000 ug | INTRAMUSCULAR | Status: DC | PRN
  Administered 2023-10-23: 100 ug via INTRAVENOUS
  Filled 2023-10-23: qty 2

## 2023-10-23 MED ORDER — ACETAMINOPHEN 500 MG PO TABS
1000.0000 mg | ORAL_TABLET | Freq: Three times a day (TID) | ORAL | Status: DC
Start: 2023-10-24 — End: 2023-10-25
  Administered 2023-10-23 – 2023-10-25 (×6): 1000 mg via ORAL
  Filled 2023-10-23 (×6): qty 2

## 2023-10-23 MED ORDER — LACTATED RINGERS IV SOLN: INTRAVENOUS | Status: DC

## 2023-10-23 MED ORDER — MAGNESIUM SULFATE BOLUS VIA INFUSION
4.0000 g | Freq: Once | INTRAVENOUS | Status: AC
  Administered 2023-10-23: 4 g via INTRAVENOUS
  Filled 2023-10-23: qty 1000

## 2023-10-23 MED ORDER — BENZOCAINE-MENTHOL 20-0.5 % EX AERO: 1.0000 | INHALATION_SPRAY | CUTANEOUS | Status: DC | PRN

## 2023-10-23 MED ORDER — PHENYLEPHRINE 80 MCG/ML (10ML) SYRINGE FOR IV PUSH (FOR BLOOD PRESSURE SUPPORT): 80.0000 ug | PREFILLED_SYRINGE | INTRAVENOUS | Status: DC | PRN

## 2023-10-23 MED ORDER — MEDROXYPROGESTERONE ACETATE 150 MG/ML IM SUSP: 150.0000 mg | INTRAMUSCULAR | Status: DC | PRN

## 2023-10-23 MED ORDER — NIFEDIPINE ER OSMOTIC RELEASE 30 MG PO TB24
30.0000 mg | ORAL_TABLET | Freq: Once | ORAL | Status: AC
  Administered 2023-10-23: 30 mg via ORAL
  Filled 2023-10-23: qty 1

## 2023-10-23 MED ORDER — OXYCODONE-ACETAMINOPHEN 5-325 MG PO TABS: 2.0000 | ORAL_TABLET | ORAL | Status: DC | PRN

## 2023-10-23 MED ORDER — BUPIVACAINE HCL (PF) 0.25 % IJ SOLN
INTRAMUSCULAR | Status: DC | PRN
  Administered 2023-10-23 (×2): 5 mL via EPIDURAL

## 2023-10-23 MED ORDER — DIPHENHYDRAMINE HCL 25 MG PO CAPS
25.0000 mg | ORAL_CAPSULE | Freq: Four times a day (QID) | ORAL | Status: DC | PRN
Start: 2023-10-23 — End: 2023-10-25

## 2023-10-23 MED ORDER — OXYCODONE-ACETAMINOPHEN 5-325 MG PO TABS: 1.0000 | ORAL_TABLET | ORAL | Status: DC | PRN

## 2023-10-23 MED ORDER — LIDOCAINE HCL (PF) 1 % IJ SOLN: 30.0000 mL | INTRAMUSCULAR | Status: DC | PRN

## 2023-10-23 MED ORDER — LIDOCAINE HCL (PF) 1 % IJ SOLN
INTRAMUSCULAR | Status: DC | PRN
  Administered 2023-10-23: 3 mL via EPIDURAL
  Administered 2023-10-23: 2 mL via EPIDURAL
  Administered 2023-10-23: 5 mL via EPIDURAL

## 2023-10-23 MED ORDER — OXYTOCIN-SODIUM CHLORIDE 30-0.9 UT/500ML-% IV SOLN: 2.5000 [IU]/h | INTRAVENOUS | Status: DC

## 2023-10-23 MED ORDER — MAGNESIUM SULFATE 40 GM/1000ML IV SOLN
2.0000 g/h | INTRAVENOUS | Status: DC
  Filled 2023-10-23: qty 1000

## 2023-10-23 MED ORDER — COCONUT OIL OIL: 1.0000 | TOPICAL_OIL | Status: DC | PRN

## 2023-10-23 MED ORDER — SIMETHICONE 80 MG PO CHEW
80.0000 mg | CHEWABLE_TABLET | ORAL | Status: DC | PRN
Start: 2023-10-23 — End: 2023-10-25

## 2023-10-23 MED ORDER — FUROSEMIDE 40 MG PO TABS
40.0000 mg | ORAL_TABLET | Freq: Every day | ORAL | Status: DC
  Administered 2023-10-24 – 2023-10-25 (×2): 40 mg via ORAL
  Filled 2023-10-23 (×2): qty 1

## 2023-10-23 MED ORDER — OXYTOCIN BOLUS FROM INFUSION
333.0000 mL | Freq: Once | INTRAVENOUS | Status: AC
  Administered 2023-10-23: 333 mL via INTRAVENOUS

## 2023-10-23 MED ORDER — LABETALOL HCL 5 MG/ML IV SOLN: 40.0000 mg | INTRAVENOUS | Status: DC | PRN

## 2023-10-23 MED ORDER — MISOPROSTOL 50MCG HALF TABLET: 50.0000 ug | ORAL_TABLET | Freq: Once | ORAL | Status: DC

## 2023-10-23 MED ORDER — SOD CITRATE-CITRIC ACID 500-334 MG/5ML PO SOLN: 30.0000 mL | ORAL | Status: DC | PRN

## 2023-10-23 MED ORDER — LABETALOL HCL 5 MG/ML IV SOLN: 80.0000 mg | INTRAVENOUS | Status: DC | PRN

## 2023-10-23 MED ORDER — MAGNESIUM SULFATE 40 GM/1000ML IV SOLN
2.0000 g/h | INTRAVENOUS | Status: AC
  Administered 2023-10-24: 2 g/h via INTRAVENOUS
  Filled 2023-10-23: qty 1000

## 2023-10-23 MED ORDER — ACETAMINOPHEN 325 MG PO TABS: 650.0000 mg | ORAL_TABLET | ORAL | Status: DC | PRN

## 2023-10-23 MED ORDER — ONDANSETRON HCL 4 MG/2ML IJ SOLN: 4.0000 mg | Freq: Four times a day (QID) | INTRAMUSCULAR | Status: DC | PRN

## 2023-10-23 MED ORDER — DIBUCAINE (PERIANAL) 1 % EX OINT: 1.0000 | TOPICAL_OINTMENT | CUTANEOUS | Status: DC | PRN

## 2023-10-23 MED ORDER — ONDANSETRON HCL 4 MG PO TABS: 4.0000 mg | ORAL_TABLET | ORAL | Status: DC | PRN

## 2023-10-23 MED ORDER — WITCH HAZEL-GLYCERIN EX PADS: 1.0000 | MEDICATED_PAD | CUTANEOUS | Status: DC | PRN

## 2023-10-23 MED ORDER — SENNOSIDES-DOCUSATE SODIUM 8.6-50 MG PO TABS
2.0000 | ORAL_TABLET | Freq: Every day | ORAL | Status: DC
  Administered 2023-10-24 – 2023-10-25 (×2): 2 via ORAL
  Filled 2023-10-23 (×2): qty 2

## 2023-10-23 NOTE — Progress Notes (Signed)
 Hypoglycemic Event  CBG: 60   Treatment: 8 oz juice/soda  Symptoms: None  Follow-up CBG: Time:1553                                  CBG Result:86  Possible Reasons for Event: Inadequate meal intake  Comments/MD notified: Dr. Jamison Mccreedy

## 2023-10-23 NOTE — Discharge Summary (Signed)
 Postpartum Discharge Summary     Patient Name: Ashley Hawkins DOB: 04-May-2001 MRN: [id number]  Date of admission: 10/22/2023 Delivery date:10/23/2023 Delivering provider: Maud Sorenson Date of discharge: 10/25/2023 OB Clinic: [facility]  Admitting diagnosis: Pregnancy at 37/2. GHTN IOL. GDMA1 Intrauterine pregnancy: [redacted]w[redacted]d      Additional problems: [place] trait. H/o +antibody screen. Hgb C disease. Aaron Aas     Discharge diagnosis: Term Pregnancy Delivered, Preeclampsia (mild), and GDM A1                                              Post partum procedures:None Augmentation: AROM and Pitocin  Complications: None  Hospital course: Onset of Labor With Vaginal Delivery      23 y.o. yo G1P1001 at [redacted]w[redacted]d was admitted in Latent Labor on 10/22/2023 for gHTN. Labor course was complicated by development of preeclampsia with severe features based on pressures.  Membrane Rupture Time/Date: 11:06 AM,10/23/2023  Delivery Method:Vaginal, Spontaneous Operative Delivery: N/A Episiotomy: None Lacerations:  2nd degree;Periurethral Patient had a postpartum course complicated by nothing. She received 24h of PP Mg and did well on procardia , lasix  and kdur. GDM did well with no medicatioins.  She is ambulating, tolerating a regular diet, passing flatus, and urinating well. Patient is discharged home in stable condition on 10/25/23.  Newborn Data: Birth date:10/23/2023 Birth time:8:28 PM Gender:Female Living status:Living Apgars:9 ,9  Weight:3110 g  Magnesium  Sulfate received: Yes: Seizure prophylaxis BMZ received: No Rhophylac:No Transfusion:No  Immunizations received: Immunization History  Administered Date(s) Administered   Influenza, Seasonal, Injecte, Preservative Fre 05/25/2023    Physical exam  Vitals:   10/24/23 2033 10/24/23 2355 10/25/23 0543 10/25/23 0827  BP:  117/62 125/79 117/67  Pulse:  79 86 92  Resp: 17 16 18 18   Temp:  97.9 F (36.6 C) 98.1 F (36.7 C) 98.2 F (36.8 C)  TempSrc:   Oral Oral Oral  SpO2:  99% 100% 99%  Weight:      Height:       General: alert Lochia: appropriate Uterine Fundus: firm, nttp Incision: deferred DVT Evaluation: No evidence of DVT seen on physical exam. Labs: Lab Results  Component Value Date   WBC 27.3 (H) 10/24/2023   HGB 9.7 (L) 10/24/2023   HCT 26.1 (L) 10/24/2023   MCV 81.6 10/24/2023   PLT 149 (L) 10/24/2023      Latest Ref Rng & Units 10/25/2023    6:08 AM  CMP  Glucose 70 - 99 mg/dL 75   BUN 6 - 20 mg/dL 5   Creatinine 6.96 - 2.95 mg/dL 2.84   Sodium 132 - 440 mmol/L 137   Potassium 3.5 - 5.1 mmol/L 3.3   Chloride 98 - 111 mmol/L 105   CO2 22 - 32 mmol/L 27   Calcium 8.9 - 10.3 mg/dL 7.9    Edinburgh Score:     No data to display         No data recorded  After visit meds:  Allergies as of 10/25/2023       Reactions   Codeine  Hives        Medication List     STOP taking these medications    Accu-Chek Guide Test test strip Generic drug: glucose blood   Accu-Chek Guide w/Device Kit   Accu-Chek Softclix Lancets lancets       TAKE these medications  Blood Pressure Kit Devi 1 kit by Does not apply route once a week.   cyclobenzaprine  10 MG tablet Commonly known as: FLEXERIL  Take 1 tablet (10 mg total) by mouth 3 (three) times daily as needed for muscle spasms.   ferrous sulfate  325 (65 FE) MG tablet Take 1 tablet (325 mg total) by mouth every other day. Start taking on: October 26, 2023   furosemide  40 MG tablet Commonly known as: LASIX  Take 1 tablet (40 mg total) by mouth daily for 3 days. Start taking on: October 26, 2023   Gojji Weight Scale Misc 1 Device by Does not apply route every 30 (thirty) days.   ibuprofen  600 MG tablet Commonly known as: ADVIL  Take 1 tablet (600 mg total) by mouth every 6 (six) hours as needed.   loratadine  10 MG tablet Commonly known as: Claritin  Take 1 tablet (10 mg total) by mouth daily.   NIFEdipine  30 MG 24 hr tablet Commonly known as: ADALAT   CC Take 1 tablet (30 mg total) by mouth daily. Start taking on: October 26, 2023   potassium chloride  SA 20 MEQ tablet Commonly known as: KLOR-CON  M Take 2 tablets (40 mEq total) by mouth daily for 3 days. Start taking on: October 26, 2023   Prenate Mini  18-0.6-0.4-350 MG Caps Take 1 tablet by mouth daily.   simethicone  80 MG chewable tablet Commonly known as: MYLICON Chew 1 tablet (80 mg total) by mouth as needed for flatulence.         Discharge home in stable condition Infant Feeding: Breast Infant Disposition:home with mother Discharge instruction: per After Visit Summary and Postpartum booklet. Activity: Advance as tolerated. Pelvic rest for 6 weeks.  Diet: routine diet Future Appointments: Future Appointments  Date Time Provider Department Center  10/30/2023  9:00 AM [facility] NURSE [facility] None  11/07/2023 11:30 AM [facility]  11/22/2023  3:10 PM Abigail Abler, MD [facility] None   Follow up Visit:  Follow-up Information     [facility] Follow up on 10/30/2023.   Why: blood pressure check Contact information: [address]  [address] [phone]                Message sent to [facility] 6/3  Please schedule this patient for a In person postpartum visit in 6 weeks with the following provider: Any provider. Additional Postpartum F/U: BP check 1 week  Low risk pregnancy complicated by: preeclampsia w/SF Delivery mode:  Vaginal, Spontaneous Anticipated Birth Control:  unsure   10/25/2023 Raynell Caller, MD

## 2023-10-23 NOTE — Progress Notes (Signed)
 Hypoglycemic Event  CBG: 66   Treatment: 8 oz juice/soda  Symptoms: None  Follow-up CBG: Time: 1215 CBG Result: 91   Possible Reasons for Event: Inadequate meal intake  Comments/MD notified: pt asymptomatic and glucose increased with standing protocol implemented     Ashley Hawkins

## 2023-10-23 NOTE — Progress Notes (Signed)
 LABOR PROGRESS NOTE  Patient Name: Ashley Hawkins, female   DOB: [birth year], 23 y.o.  MRN: [id number]  In to check patient. Has gotten rest today. Cervix 9.5/100/-1. Will place in upright positioning to promote descent. Plan to reassess in 1-2 hours to start pushing.  #gHTN: Several borderline severe range pressures. If having to treat BP, will need mag, as this will rule her in for preE w/SF based on severe range pressures.  Maud Sorenson, MD

## 2023-10-23 NOTE — Anesthesia Preprocedure Evaluation (Signed)
 Anesthesia Evaluation  Patient identified by MRN, date of birth, ID band Patient awake    Reviewed: Allergy & Precautions, Patient's Chart, lab work & pertinent test results  Airway Mallampati: II  TM Distance: >3 FB     Dental   Pulmonary neg pulmonary ROS   Pulmonary exam normal        Cardiovascular hypertension, Normal cardiovascular exam     Neuro/Psych negative neurological ROS     GI/Hepatic negative GI ROS, Neg liver ROS,,,  Endo/Other  diabetes, Gestational    Renal/GU negative Renal ROS     Musculoskeletal   Abdominal   Peds  Hematology  (+) Blood dyscrasia, Sickle cell trait   Anesthesia Other Findings   Reproductive/Obstetrics (+) Pregnancy                             Anesthesia Physical Anesthesia Plan  ASA: 3  Anesthesia Plan: Epidural   Post-op Pain Management:    Induction:   PONV Risk Score and Plan: 2  Airway Management Planned: Natural Airway  Additional Equipment:   Intra-op Plan:   Post-operative Plan:   Informed Consent: I have reviewed the patients History and Physical, chart, labs and discussed the procedure including the risks, benefits and alternatives for the proposed anesthesia with the patient or authorized representative who has indicated his/her understanding and acceptance.       Plan Discussed with:   Anesthesia Plan Comments:        Anesthesia Quick Evaluation

## 2023-10-23 NOTE — Progress Notes (Signed)
 LABOR PROGRESS NOTE  Patient Name: Ashley Hawkins, female   DOB: [birth year], 23 y.o.  MRN: [id number]  Patient now comfortable s/p epidural. Amenable to AROM. R/B/A of AROM discussed with patient, and verbal consent obtained. Babe's head found to be well-applied. Obtained moderate clear fluid. Mom and babe tolerated well. Cat I. Continue to titrate pit as needed.  Maud Sorenson, MD

## 2023-10-23 NOTE — H&P (Signed)
 Ashley Hawkins is a 23 y.o. female presenting for contractions, had several elevated BPs in MAU and combined with BP results from 5/31 meets criteria for gHTN in pregnancy at term. She also has A1GDM.  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 partner test declined.   GDM--diet  Previous Sono: 09/19/23 @ [redacted] weeks gestation  Est. FW: 1858 gm 4 lb 2 oz 21 %  OB History     Gravida  1   Para  0   Term  0   Preterm  0   AB  0   Living  0      SAB  0   IAB  0   Ectopic  0   Multiple  0   Live Births             Past Medical History:  Diagnosis Date   Sickle cell trait (HCC) 05/06/2020   Past Surgical History:  Procedure Laterality Date   APPENDECTOMY  2019   Family History: family history includes Hypertension in her maternal grandmother and maternal uncle. Social History:  reports that she has never smoked. She has been exposed to tobacco smoke. She has never used smokeless tobacco. She reports that she does not drink alcohol and does not use drugs.           NURSING  PROVIDER  Office Location [facility] Dating by LMP c/w U/S at 6 wks  [facility] Model Traditional Anatomy U/S    Initiated care at  [facility]  English               LAB RESULTS   Support Person Mom[employer] [employer] NIPS: low risk AFP:       NT/IT (FT only)        Carrier Screen Horizon: +Hgb C, SMA carrier  Rhogam  O/Positive/-- (11/27 1509) A1C/GTT Early:  Third trimester: 75/185/107  Flu Vaccine 05/25/2023      TDaP Vaccine   Blood Type O/Positive/-- (11/27 1509)  Covid Vaccine Vaccinated Antibody Comment, See Final Results (12/06 1045)  RSV Vaccine   Rubella 21.80 (11/27 1509)  Feeding Plan both RPR Non Reactive (03/31 0926)  Contraception Depo-Provera  HBsAg Negative (11/27 1509)  Circumcision Yes if a boy HIV Non Reactive (03/31 0926)  Pediatrician  [facility] HCVAb Non Reactive (11/27 1509)  Prenatal Classes        BTL  Consent   Pap       Diagnosis  Date Value Ref Range Status  04/27/2023     Final    - Negative for intraepithelial lesion or malignancy (NILM)    BTL Pre-payment   GC/CT Initial:   36wks:    VBAC Consent   GBS Negative/-- (05/27 1115) For PCN allergy, check sensitivities            DME Rx [facility] ] BP cuff [facility] ] Weight Scale Waterbirth  [ ]  Class [ ]  Consent [ ]  CNM visit  PHQ9 & GAD7 [ X ] new OB [  ] 28 weeks  [  ] 36 weeks Induction  [ ]  Orders Entered [ ] Foley Y/N    Review of Systems  Constitutional:  Negative for chills, fatigue and fever.  Eyes:  Negative for pain and visual disturbance.  Respiratory:  Negative for apnea, shortness of breath and wheezing.   Cardiovascular:  Negative for  chest pain and palpitations.  Gastrointestinal:  Positive for abdominal pain. Negative for constipation, diarrhea, nausea and vomiting.  Genitourinary:  Positive for pelvic pain. Negative for difficulty urinating, dysuria, vaginal bleeding, vaginal discharge and vaginal pain.  Musculoskeletal:  Positive for back pain.  Neurological:  Negative for seizures, weakness and headaches.  Psychiatric/Behavioral:  Negative for suicidal ideas.    Maternal Medical History:  Reason for admission: Contractions.  Nausea.  Contractions: Onset was 3-5 hours ago.   Frequency: irregular.   Fetal activity: Perceived fetal activity is normal.   Prenatal Complications - Diabetes: gestational. Diabetes is managed by diet.     Dilation: 1.5 Effacement (%): 50 Station: -2 Exam by:: Gae Jointer, RN Blood pressure (!) 134/90, pulse 66, resp. rate 18, height 5\' 5"  (1.651 m), weight 75.8 kg, last menstrual period 02/04/2023, SpO2 100%. Maternal Exam:  Uterine Assessment: Contraction strength is moderate.  Contraction frequency is irregular.  Abdomen: Patient reports no abdominal tenderness. Fetal presentation: vertex Pelvis: adequate for delivery.   Cervix: Cervix evaluated by digital exam.     Fetal  Exam Fetal Monitor Review: Baseline rate: 130.  Variability: moderate (6-25 bpm).   Pattern: accelerations present and no decelerations.   Fetal State Assessment: Category I - tracings are normal.   Physical Exam Vitals and nursing note reviewed. Exam conducted with a chaperone present.  Constitutional:      General: She is not in acute distress.    Appearance: Normal appearance.  HENT:     Head: Normocephalic.  Pulmonary:     Effort: Pulmonary effort is normal.  Musculoskeletal:     Cervical back: Normal range of motion.  Skin:    General: Skin is warm and dry.  Neurological:     Mental Status: She is alert and oriented to person, place, and time.  Psychiatric:        Mood and Affect: Mood normal.     Prenatal labs: ABO, Rh: O/Positive/-- (11/27 1509) Antibody: Comment, See Final Results (12/06 1045) Rubella: 21.80 (11/27 1509) RPR: Non Reactive (03/31 0926)  HBsAg: Negative (11/27 1509)  HIV: Non Reactive (03/31 0926)  GBS: Negative/-- (05/27 1115)   Assessment/Plan: Ashley Hawkins , a  23 y.o. G1P0000 at [redacted]w[redacted]d presents for IOL for gHTN at term with A1GDM.   Labor: Patient making cervical change independently without intervention. Continue with expectant management at this time.  FWB: Cat I at this time, continue to monitor gHTN: PreE labs collected as a baseline.  I/D: GBS negative  A1GDM: Q4 hr CBG  MOF: Breast and Bottle  MOC: Depo  Circ: yes if boy  MOD: Hopeful for vaginal delivery.   Corie Diamond 10/23/2023, 12:14 AM

## 2023-10-23 NOTE — Anesthesia Procedure Notes (Addendum)
 Epidural Patient location during procedure: OB Start time: 10/23/2023 10:26 AM End time: 10/23/2023 10:34 AM  Staffing Anesthesiologist: Peggy Bowens, MD Performed: anesthesiologist   Preanesthetic Checklist Completed: patient identified, IV checked, site marked, risks and benefits discussed, surgical consent, monitors and equipment checked, pre-op evaluation and timeout performed  Epidural Patient position: sitting Prep: DuraPrep and site prepped and draped Patient monitoring: continuous pulse ox and blood pressure Approach: midline Location: L4-L5 Injection technique: LOR air  Needle:  Needle type: Tuohy  Needle gauge: 17 G Needle length: 9 cm and 9 Needle insertion depth: 5.5 cm Catheter type: closed end flexible Catheter size: 19 Gauge Catheter at skin depth: 11 cm Test dose: negative  Assessment Events: blood not aspirated, no cerebrospinal fluid, injection not painful, no injection resistance, no paresthesia and negative IV test

## 2023-10-23 NOTE — Progress Notes (Signed)
 LABOR NOTE Ashley Hawkins is a 23 y.o. G1P0000 at [redacted]w[redacted]d admitted for induction of labor due to diabetes mellitus A1DM and gestational hypertension   Subjective: beginning to get uncomfortable w/ contractions  Objective: BP 130/82   Pulse (!) 56   Temp 98.3 F (36.8 C) (Oral)   Resp 18   Ht 5\' 5"  (1.651 m)   Wt 75.8 kg   LMP 02/04/2023   SpO2 100%   BMI 27.81 kg/m  No intake/output data recorded.  FHR baseline 125 bpm, Variability: moderate, Accelerations:present, Decelerations:  Absent Toco: q 2-7 mins   SVE:   Dilation: 4.5 Effacement (%): 70 Station: 0 Exam by:: [employer] RN   Labs: Lab Results  Component Value Date   WBC 16.3 (H) 10/23/2023   HGB 10.9 (L) 10/23/2023   HCT 29.2 (L) 10/23/2023   MCV 81.6 10/23/2023   PLT 158 10/23/2023  Patient Vitals for the past 24 hrs:  BP Temp Temp src Pulse Resp SpO2 Height Weight  10/23/23 0524 130/82 -- -- (!) 56 -- -- -- --  10/23/23 0303 125/84 98.3 F (36.8 C) Oral 66 18 -- -- --  10/23/23 0152 127/87 -- -- 68 -- -- -- --  10/23/23 0037 (!) 151/80 99 F (37.2 C) Oral 64 18 -- -- --  10/23/23 0016 (!) 154/90 -- -- 67 -- -- -- --  10/23/23 0004 (!) 134/90 -- -- 66 -- -- -- --  10/22/23 2331 120/81 -- Oral 65 18 -- 5\' 5"  (1.651 m) 75.8 kg  10/22/23 2330 -- -- -- -- -- 100 % -- --       Assessment / Plan: IOL d/t diabetes mellitus A1DM and gestational hypertension Minimal cervical change following expectant management.    Labor: early. Plan to start pit 2x2 and continue to titrate up as needed.  Fetal Wellbeing:  Category I Pain Control:  requesting IV pain meds Pre-eclampsia: bp's stable and labs stable PreE labs normal  I/D:  GBS neg Anticipated MOD: hopeful for vaginal birth  Corie Diamond MSN, CNM  10/23/2023, 5:40 AM

## 2023-10-24 LAB — MAGNESIUM: Magnesium: 4.8 mg/dL — ABNORMAL HIGH (ref 1.7–2.4)

## 2023-10-24 LAB — CBC
HCT: 31.2 % — ABNORMAL LOW (ref 36.0–46.0)
HCT: 26.1 % — ABNORMAL LOW (ref 36.0–46.0)
Hemoglobin: 11.8 g/dL — ABNORMAL LOW (ref 12.0–15.0)
Hemoglobin: 9.7 g/dL — ABNORMAL LOW (ref 12.0–15.0)
MCH: 30.3 pg (ref 26.0–34.0)
MCH: 30.3 pg (ref 26.0–34.0)
MCHC: 37.8 g/dL — ABNORMAL HIGH (ref 30.0–36.0)
MCHC: 37.2 g/dL — ABNORMAL HIGH (ref 30.0–36.0)
MCV: 80.2 fL (ref 80.0–100.0)
MCV: 81.6 fL (ref 80.0–100.0)
Platelets: 132 10*3/uL — ABNORMAL LOW (ref 150–400)
Platelets: 149 10*3/uL — ABNORMAL LOW (ref 150–400)
RBC: 3.89 MIL/uL (ref 3.87–5.11)
RBC: 3.2 MIL/uL — ABNORMAL LOW (ref 3.87–5.11)
RDW: 14.9 % (ref 11.5–15.5)
RDW: 14.9 % (ref 11.5–15.5)
WBC: 27.9 10*3/uL — ABNORMAL HIGH (ref 4.0–10.5)
WBC: 27.3 10*3/uL — ABNORMAL HIGH (ref 4.0–10.5)
nRBC: 0.3 % — ABNORMAL HIGH (ref 0.0–0.2)
nRBC: 0.3 % — ABNORMAL HIGH (ref 0.0–0.2)

## 2023-10-24 LAB — COMPREHENSIVE METABOLIC PANEL WITH GFR
ALT: 11 U/L (ref 0–44)
AST: 27 U/L (ref 15–41)
Albumin: 2.4 g/dL — ABNORMAL LOW (ref 3.5–5.0)
Alkaline Phosphatase: 89 U/L (ref 38–126)
Anion gap: 10 (ref 5–15)
BUN: <5 mg/dL — ABNORMAL LOW (ref 6–20)
CO2: 18 mmol/L — ABNORMAL LOW (ref 22–32)
Calcium: 7.7 mg/dL — ABNORMAL LOW (ref 8.9–10.3)
Chloride: 106 mmol/L (ref 98–111)
Creatinine, Ser: 0.72 mg/dL (ref 0.44–1.00)
GFR, Estimated: >60 mL/min (ref >60)
Glucose, Bld: 79 mg/dL (ref 70–99)
Potassium: 2.9 mmol/L — ABNORMAL LOW (ref 3.5–5.1)
Sodium: 134 mmol/L — ABNORMAL LOW (ref 135–145)
Total Bilirubin: 1.9 mg/dL — ABNORMAL HIGH (ref 0.0–1.2)
Total Protein: 5.6 g/dL — ABNORMAL LOW (ref 6.5–8.1)

## 2023-10-24 MED ORDER — FERROUS SULFATE 325 (65 FE) MG PO TABS
325.0000 mg | ORAL_TABLET | ORAL | Status: DC
  Administered 2023-10-24: 325 mg via ORAL
  Filled 2023-10-24: qty 1

## 2023-10-24 MED ORDER — POTASSIUM CHLORIDE CRYS ER 20 MEQ PO TBCR
40.0000 meq | EXTENDED_RELEASE_TABLET | Freq: Every day | ORAL | Status: DC
  Administered 2023-10-25: 40 meq via ORAL
  Filled 2023-10-24: qty 2

## 2023-10-24 MED ORDER — NIFEDIPINE ER OSMOTIC RELEASE 30 MG PO TB24
30.0000 mg | ORAL_TABLET | Freq: Every day | ORAL | Status: DC
  Administered 2023-10-24 – 2023-10-25 (×2): 30 mg via ORAL
  Filled 2023-10-24 (×2): qty 1

## 2023-10-24 MED ORDER — POTASSIUM CHLORIDE CRYS ER 20 MEQ PO TBCR
40.0000 meq | EXTENDED_RELEASE_TABLET | Freq: Two times a day (BID) | ORAL | Status: AC
  Administered 2023-10-24 (×2): 40 meq via ORAL
  Filled 2023-10-24 (×2): qty 2

## 2023-10-24 NOTE — Lactation Note (Signed)
 This note was copied from a baby's chart. Lactation Consultation Note  Patient Name: Ashley Hawkins Today's Date: 10/24/2023 Age:24 hours  Per RN ,in [facility] , MOB would like to be seen by [facility] services in morning.Infant was recently formula feed. MOB feeding choice is breast and formula feeding. RN will set MOB up with DEBP tonight.    Maternal Data    Feeding    LATCH Score                    Lactation Tools Discussed/Used    Interventions    Discharge    Consult Status      Ashley Hawkins 10/24/2023, 2:21 AM

## 2023-10-24 NOTE — Progress Notes (Signed)
 Post Partum Day 1 s/p SVD at [redacted]w[redacted]d, IOL  Subjective: Complains of mild headache, is looking forward to being off magnesium sulfate later.  Voiding, tolerating PO, and + flatus.   Patient denies any visual symptoms, RUQ/epigastric pain or other concerning symptoms.  Objective: Blood pressure 120/83, pulse 90, temperature 98.6 F (37 C), temperature source Oral, resp. rate 20, height 5\' 5"  (1.651 m), weight 75.8 kg, last menstrual period 02/04/2023, SpO2 100%, unknown if currently breastfeeding. Vitals:   10/24/23 0301 10/24/23 0414 10/24/23 0500 10/24/23 0600  BP: (!) 143/81 120/83    Pulse: 94 90    Resp: 18 20 20 20   Temp:  98.6 F (37 C)    TempSrc:  Oral    SpO2:  100%    Weight:      Height:        Physical Exam:  General: alert and no distress Lochia: appropriate Uterine Fundus: firm, nontender DVT Evaluation: No evidence of DVT seen on physical exam.  Negative Homan's sign.  No cords or calf tenderness. No significant calf/ankle edema.  Labs:    Latest Ref Rng & Units 10/24/2023    4:19 AM 10/23/2023    9:54 PM 10/23/2023    9:48 AM  CBC  WBC 4.0 - 10.5 K/uL 27.3  27.9  19.3   Hemoglobin 12.0 - 15.0 g/dL 9.7  62.9  52.8   Hematocrit 36.0 - 46.0 % 26.1  31.2  28.8   Platelets 150 - 400 K/uL 149  132  123       Latest Ref Rng & Units 10/24/2023    4:19 AM 10/23/2023   12:18 AM 10/20/2023   11:53 PM  CMP  Glucose 70 - 99 mg/dL 79  83  82   BUN 6 - 20 mg/dL 5  5  5    Creatinine 0.44 - 1.00 mg/dL 4.13  2.44  0.10   Sodium 135 - 145 mmol/L 134  137  136   Potassium 3.5 - 5.1 mmol/L 2.9  3.2  3.2   Chloride 98 - 111 mmol/L 106  109  108   CO2 22 - 32 mmol/L 18  19  21    Calcium 8.9 - 10.3 mg/dL 7.7  8.9  8.7   Total Protein 6.5 - 8.1 g/dL 5.6  6.6  6.8   Total Bilirubin 0.0 - 1.2 mg/dL 1.9  1.9  1.5   Alkaline Phos 38 - 126 U/L 89  110  113   AST 15 - 41 U/L 27  19  19    ALT 0 - 44 U/L 11  11  11      Assessment/Plan: Severe preeclampsia:  BP stable this morning.   Continue Procardia XL 30 mg daily, also ordered for Lasix 20 mg daily.  On magnesium sulfate for eclampsia prophylaxis, to be turned off around 2030. Hypokalemia: Ordered for KCL 40 mEq bid, followed by 40 mEq daily for 5 days.  Will recheck BMP tomorrow and may change repletion if needed. Anemia:Patient was started on oral iron therapy for clinically significant but asymptomatic acute postpartum anemia due to expected blood loss. A1GDM: Blood glucose 79 this morning, will do 2 hr GTT postpartum Postpartum: Patient is breastfeeding, will get lactation consultant support. Desires Depo Provera , will get prior to discharge Routine postpartum care.   LOS: 1 day   Ashley Gfeller, MD 10/24/2023, 7:26 AM

## 2023-10-24 NOTE — Consult Note (Addendum)
   Outpatient Lactation Booking Note  Visited parent to establish outpatient lactation care. Mom stated that she has had some trouble with latching baby because she is sleep but she has been giving bottle. Has not pumped yet mom states she feels like she doesn't have milk because she hasn't been leaking. LC reviewed and educated on:  -colostrum phase  -milk production expectations/supply and demand/breast stimulation  -skin to skin  -infant feeding behaviors for 37w   Parent accepted outpatient lactation care.  Future Appointments  Date Time Provider Department Center  10/30/2023  9:00 AM [facility] NURSE [facility] None  11/07/2023 11:30 AM [facility] CONSULTANT [facility]  11/22/2023  3:10 PM Abigail Abler, MD [facility] None    Appointment instructions added to discharge instructions.  Alyce Jubilee, IBCLC and Orlie Bjornstad BSW [facility] for [facility]

## 2023-10-24 NOTE — Anesthesia Postprocedure Evaluation (Signed)
 Anesthesia Post Note  Patient: Ashley Hawkins  Procedure(s) Performed: AN AD HOC LABOR EPIDURAL     Patient location during evaluation: Mother Baby Anesthesia Type: Epidural Level of consciousness: awake and alert Pain management: pain level controlled Vital Signs Assessment: post-procedure vital signs reviewed and stable Respiratory status: spontaneous breathing, nonlabored ventilation and respiratory function stable Cardiovascular status: stable Postop Assessment: no headache, no backache and epidural receding Anesthetic complications: no   No notable events documented.  Last Vitals:  Vitals:   10/24/23 0500 10/24/23 0600  BP:    Pulse:    Resp: 20 20  Temp:    SpO2:      Last Pain:  Vitals:   10/24/23 0801  TempSrc:   PainSc: 6    Pain Goal:                   [employer]

## 2023-10-24 NOTE — Patient Instructions (Signed)
 Your appointment with Outpatient Lactation is: Date: 11/07/2023 Time: 11:30 am  [facility] (First Floor) [address] [place]  Check in under baby's name.  Please bring your baby hungry along with your pump and a bottle of either formula or expressed breast milk. Please also bring your pump flanges and we welcome support people! If you need lactation assistance before your appointment, please call [phone] and press 4 for lactation.

## 2023-10-25 ENCOUNTER — Ambulatory Visit

## 2023-10-25 ENCOUNTER — Other Ambulatory Visit (HOSPITAL_COMMUNITY): Payer: Self-pay

## 2023-10-25 LAB — BASIC METABOLIC PANEL WITH GFR
Anion gap: 5 (ref 5–15)
BUN: 5 mg/dL — ABNORMAL LOW (ref 6–20)
CO2: 27 mmol/L (ref 22–32)
Calcium: 7.9 mg/dL — ABNORMAL LOW (ref 8.9–10.3)
Chloride: 105 mmol/L (ref 98–111)
Creatinine, Ser: 0.68 mg/dL (ref 0.44–1.00)
GFR, Estimated: >60 mL/min (ref >60)
Glucose, Bld: 75 mg/dL (ref 70–99)
Potassium: 3.3 mmol/L — ABNORMAL LOW (ref 3.5–5.1)
Sodium: 137 mmol/L (ref 135–145)

## 2023-10-25 MED ORDER — SIMETHICONE 80 MG PO CHEW
80.0000 mg | CHEWABLE_TABLET | ORAL | 1 refills | Status: AC | PRN
  Filled 2023-10-25: qty 10, 3d supply, fill #0

## 2023-10-25 MED ORDER — FUROSEMIDE 40 MG PO TABS
40.0000 mg | ORAL_TABLET | Freq: Every day | ORAL | 0 refills | Status: AC
  Filled 2023-10-25: qty 3, 3d supply, fill #0

## 2023-10-25 MED ORDER — NIFEDIPINE ER 30 MG PO TB24
30.0000 mg | ORAL_TABLET | Freq: Every day | ORAL | 0 refills | Status: DC
  Filled 2023-10-25: qty 42, 42d supply, fill #0

## 2023-10-25 MED ORDER — POTASSIUM CHLORIDE CRYS ER 20 MEQ PO TBCR
40.0000 meq | EXTENDED_RELEASE_TABLET | Freq: Every day | ORAL | 0 refills | Status: AC
  Filled 2023-10-25: qty 6, 3d supply, fill #0

## 2023-10-25 MED ORDER — IBUPROFEN 600 MG PO TABS
600.0000 mg | ORAL_TABLET | Freq: Four times a day (QID) | ORAL | 0 refills | Status: AC | PRN
  Filled 2023-10-25: qty 30, 8d supply, fill #0

## 2023-10-25 MED ORDER — FERROUS SULFATE 325 (65 FE) MG PO TABS
325.0000 mg | ORAL_TABLET | ORAL | 0 refills | Status: AC
  Filled 2023-10-25: qty 21, 42d supply, fill #0

## 2023-10-25 NOTE — Lactation Note (Signed)
 This note was copied from a baby's chart. Lactation Consultation Note  Patient Name: Girl Syona Wroblewski Today's Date: 10/25/2023 Age:24 hours, P1  Reason for consult: Initial assessment;Primapara;1st time breastfeeding;Early term 37-38.6wks;Infant weight loss;Maternal endocrine disorder;Exclusive pumping and bottle feeding (7 % weight loss. the [facility] nurse called this LC and mentioned mom desires to pump and formula feed.) The nurse set up the pump and the LC instructed mom on the use and checked the flanges,per mom the #21 F was comfortable for both breast.  LC will tube 2 - #18's down to [facility] for mom to test to see which one is more comfortable.   Maternal Data Does the patient have breastfeeding experience prior to this delivery?: No  Feeding Mother's Current Feeding Choice: Breast Milk and Formula Nipple Type: Nfant Standard Flow (white)    Lactation Tools Discussed/Used Tools: Pump;Flanges (pump was set up in the room and the LC intructed mom on use of the DEBP and checked the flange) Flange Size: 21;18 Breast pump type: Manual;Double-Electric Breast Pump Pump Education: Milk Storage;Setup, frequency, and cleaning  Interventions  Education , storage of breast milk, and LC resource sheet   Discharge Pump: DEBP;Personal WIC Program: Yes  Consult Status Consult Status: Follow-up Date: 10/26/23 Follow-up type: In-patient    Renda Carpen Ruhani Umland 10/25/2023, 3:12 PM

## 2023-10-25 NOTE — Lactation Note (Signed)
 This note was copied from a baby's chart. Lactation Consultation Note  Patient Name: Ashley Hawkins [id number] Date: 10/25/2023 Age:24 hours  LC entered the room MOB is asleep. LC observed MOB has been set up with DEBP by RN. LC services will attempt to see MOB in the morning.    Maternal Data    Feeding Nipple Type: Slow - flow  LATCH Score                    Lactation Tools Discussed/Used    Interventions    Discharge    Consult Status      Ashley Hawkins 10/25/2023, 1:39 AM

## 2023-10-25 NOTE — Plan of Care (Signed)
  Problem: Education: Goal: Knowledge of General Education information will improve Description: Including pain rating scale, medication(s)/side effects and non-pharmacologic comfort measures Outcome: Progressing   Problem: Health Behavior/Discharge Planning: Goal: Ability to manage health-related needs will improve Outcome: Progressing   Problem: Clinical Measurements: Goal: Will remain free from infection Outcome: Progressing Goal: Diagnostic test results will improve Outcome: Progressing Goal: Respiratory complications will improve Outcome: Progressing Goal: Cardiovascular complication will be avoided Outcome: Progressing   Problem: Nutrition: Goal: Adequate nutrition will be maintained Outcome: Progressing   Problem: Coping: Goal: Level of anxiety will decrease Outcome: Progressing   Problem: Elimination: Goal: Will not experience complications related to urinary retention Outcome: Progressing   Problem: Pain Managment: Goal: General experience of comfort will improve and/or be controlled Outcome: Progressing   Problem: Activity: Goal: Will verbalize the importance of balancing activity with adequate rest periods Outcome: Progressing   Problem: Life Cycle: Goal: Chance of risk for complications during the postpartum period will decrease Outcome: Progressing   Problem: Role Relationship: Goal: Ability to demonstrate positive interaction with newborn will improve Outcome: Progressing

## 2023-10-25 NOTE — Discharge Instructions (Signed)
Continue to check your blood pressures twice a day Call the office for blood pressures that are consistently above 140 for the top number or 90 for the bottom number   Hypertension During Pregnancy Hypertension is also called high blood pressure. High blood pressure means that the force of your blood moving in your body is too strong. It can cause problems for you and your baby. Different types of high blood pressure can happen during pregnancy. The types are: High blood pressure before you got pregnant. This is called chronic hypertension.  This can continue during your pregnancy. Your doctor will want to keep checking your blood pressure. You may need medicine to keep your blood pressure under control while you are pregnant. You will need follow-up visits after you have your baby. High blood pressure that goes up during pregnancy when it was normal before. This is called gestational hypertension. It will usually get better after you have your baby, but your doctor will need to watch your blood pressure to make sure that it is getting better. Very high blood pressure during pregnancy. This is called preeclampsia. Very high blood pressure is an emergency that needs to be checked and treated right away. You may develop very high blood pressure after giving birth. This is called postpartum preeclampsia. This usually occurs within 48 hours after childbirth but may occur up to 6 weeks after giving birth. This is rare. How does this affect me? If you have high blood pressure during pregnancy, you have a higher chance of developing high blood pressure: As you get older. If you get pregnant again. In some cases, high blood pressure during pregnancy can cause: Stroke. Heart attack. Damage to the kidneys, lungs, or liver. Preeclampsia. Jerky movements you cannot control (convulsions or seizures). Problems with the placenta.  What can I do to lower my risk?  Keep a healthy weight. Eat a healthy  diet. Follow what your doctor tells you about treating any medical problems that you had before becoming pregnant. It is very important to go to all of your doctor visits. Your doctor will check your blood pressure and make sure that your pregnancy is progressing as it should. Treatment should start early if a problem is found.  Follow these instructions at home:  Take your blood pressure 1-2 times per day. Call the office if your blood pressure is 155 or higher for the top number or 105 or higher for the bottom number.    Eating and drinking  Drink enough fluid to keep your pee (urine) pale yellow. Avoid caffeine. Lifestyle Do not use any products that contain nicotine or tobacco, such as cigarettes, e-cigarettes, and chewing tobacco. If you need help quitting, ask your doctor. Do not use alcohol or drugs. Avoid stress. Rest and get plenty of sleep. Regular exercise can help. Ask your doctor what kinds of exercise are best for you. General instructions Take over-the-counter and prescription medicines only as told by your doctor. Keep all prenatal and follow-up visits as told by your doctor. This is important. Contact a doctor if: You have symptoms that your doctor told you to watch for, such as: Headaches. Nausea. Vomiting. Belly (abdominal) pain. Dizziness. Light-headedness. Get help right away if: You have: Very bad belly pain that does not get better with treatment. A very bad headache that does not get better. Vomiting that does not get better. Sudden, fast weight gain. Sudden swelling in your hands, ankles, or face. Blood in your pee. Blurry vision. Double vision.  Shortness of breath. Chest pain. Weakness on one side of your body. Trouble talking. Summary High blood pressure is also called hypertension. High blood pressure means that the force of your blood moving in your body is too strong. High blood pressure can cause problems for you and your baby. Keep all  follow-up visits as told by your doctor. This is important. This information is not intended to replace advice given to you by your health care provider. Make sure you discuss any questions you have with your health care provider. Document Released: 06/10/2010 Document Revised: 08/29/2018 Document Reviewed: 06/04/2018 Elsevier Patient Education  2020 [employer].

## 2023-10-25 NOTE — Progress Notes (Signed)
 Patient given discharge instructions and medications. Patient denies any questions or concerns.

## 2023-10-26 ENCOUNTER — Ambulatory Visit (HOSPITAL_COMMUNITY): Payer: Self-pay

## 2023-10-26 NOTE — Lactation Note (Signed)
 This note was copied from a baby's chart. Lactation Consultation Note  Patient Name: Ashley Hawkins [id number] Date: 10/26/2023 Age:24 hours Reason for consult: Follow-up assessment;Primapara;1st time breastfeeding;Early term 37-38.6wks;Maternal endocrine disorder;Other (Comment);Exclusive pumping and bottle feeding (gHTN)  Visited with family of 94 51/44 weeks old female "A'zurie"; Ms. Ciliberto is a P1 and reported she's pumping but noticed it hasn't been consistent. Noticed that she was trying to feed baby a bottle when baby was already asleep, re-educated about the importance of feeding cues and baby being awake and alert prior/during feedings to protect her airways. Reinforced pace feeding technique. Parents are taking baby home today. Reviewed discharge education and the importance of consistent pumping for the onset of lactogenesis II and the prevention of engorgement. She politely declined a referral to [facility] OP as her plan is to exclusively pump and bottle feed, encouraged to pump whenever baby is getting a bottle. FOB present. All questions and concerns answered, family to contact [facility] services PRN.  Feeding Mother's Current Feeding Choice: Breast Milk and Formula  Lactation Tools Discussed/Used Tools: Pump;Flanges Flange Size: 21;18 Breast pump type: Double-Electric Breast Pump Pump Education: Setup, frequency, and cleaning;Milk Storage Reason for Pumping: PPH of 711 cc; mother's choice to exclusively pump and bottle feed Pumping frequency: 1 times/24 hours, recommended pumping every 3 hours Pumped volume:  (droplets)  Interventions Interventions: Breast feeding basics reviewed;DEBP;Education  Discharge Discharge Education: Engorgement and breast care;Warning signs for feeding baby;Outpatient recommendation Pump: Personal (Spectra  S2)  Consult Status Consult Status: Complete Date: 10/26/23 Follow-up type: Call as needed   Marlin Jarrard Newman Bare 10/26/2023, 2:12 PM

## 2023-10-30 ENCOUNTER — Ambulatory Visit

## 2023-10-30 DIAGNOSIS — Z0130 Encounter for examination of blood pressure without abnormal findings: Secondary | ICD-10-CM

## 2023-10-30 NOTE — Progress Notes (Signed)
 Subjective:  Ashley Hawkins is a 23 y.o. female here for BP check.   Hypertension ROS: no TIA's, no chest pain on exertion, no dyspnea on exertion, and no swelling of ankles. Pt currently not taking any BP meds.   Objective:  LMP 02/04/2023  BP 114/76 Appearance alert, well appearing, and in no distress. General exam BP noted to be well controlled today in office.    Assessment:   Blood Pressure well controlled.   Plan:  Orders and follow up as documented in patient record.Aaron Aas

## 2023-11-06 ENCOUNTER — Telehealth: Payer: Self-pay

## 2023-11-06 NOTE — Telephone Encounter (Signed)
 LC called to remind lactating parent of baby Ashley Hawkins's appointment on   Future Appointments  Date Time Provider Department Center  11/07/2023 11:30 AM [facility]  11/22/2023  3:10 PM Abigail Abler, MD [facility] None    Parent answered? No Message was left No  Unable to complete call.Unable to leave VM.   Monica Ann, [facility] Center for [facility]

## 2023-11-07 ENCOUNTER — Encounter

## 2023-11-22 ENCOUNTER — Ambulatory Visit: Admitting: Obstetrics and Gynecology

## 2023-11-22 ENCOUNTER — Encounter: Payer: Self-pay | Admitting: Obstetrics and Gynecology

## 2023-11-22 DIAGNOSIS — Z30013 Encounter for initial prescription of injectable contraceptive: Secondary | ICD-10-CM

## 2023-11-22 DIAGNOSIS — Z3202 Encounter for pregnancy test, result negative: Secondary | ICD-10-CM | POA: Diagnosis not present

## 2023-11-22 DIAGNOSIS — Z8632 Personal history of gestational diabetes: Secondary | ICD-10-CM

## 2023-11-22 LAB — POCT URINE PREGNANCY: Preg Test, Ur: NEGATIVE

## 2023-11-22 MED ORDER — MEDROXYPROGESTERONE ACETATE 150 MG/ML IM SUSP: 150.0000 mg | INTRAMUSCULAR | 4 refills | Status: AC

## 2023-11-22 MED ORDER — MEDROXYPROGESTERONE ACETATE 150 MG/ML IM SUSP
150.0000 mg | Freq: Once | INTRAMUSCULAR | Status: AC
Start: 2023-11-22 — End: 2023-11-22
  Administered 2023-11-22: 150 mg via INTRAMUSCULAR

## 2023-11-22 NOTE — Progress Notes (Signed)
 Date last PAP: 04/27/2023. Last Depo-Provera : NA. Side Effects if any: NA. Serum HCG indicated? NONE. Depo-Provera  150 mg IM given by: Niels Peels, CMA. Injection given in LD, tolerated well.  Next appointment due Sept. 18 - Oct. 2, 2025.   Depo-Provera  Rx, with 4 refills sent to Pharmacy on file.  Administrations This Visit     medroxyPROGESTERone  (DEPO-PROVERA ) injection 150 mg     Admin Date 11/22/2023 Action Given Dose 150 mg Route Intramuscular Documented By Peels Niels PARAS, RMA

## 2023-11-22 NOTE — Progress Notes (Signed)
 Post Partum Visit Note  [facility] Ashley Hawkins is a 23 y.o. G12P1001 female who presents for a postpartum visit. She is 4 weeks postpartum following a normal spontaneous vaginal delivery.  I have fully reviewed the prenatal and intrapartum course. The delivery was at 37.2 gestational weeks.  Anesthesia: epidural. Postpartum course has been unremarkable. Baby is doing well. Baby is feeding by bottle - Similac Advance. Bleeding no bleeding. Bowel function is normal. Bladder function is normal. Patient is not sexually active. Contraception method is Depo-Provera  injections. Postpartum depression screening: negative.   Upstream - 11/22/23 1528       Pregnancy Intention Screening   Does the patient want to become pregnant in the next year? No    Does the patient's partner want to become pregnant in the next year? No    Would the patient like to discuss contraceptive options today? Yes         The pregnancy intention screening data noted above was reviewed. Potential methods of contraception were discussed. The patient elected to proceed with No data recorded.   Edinburgh Postnatal Depression Scale - 11/22/23 1527       Edinburgh Postnatal Depression Scale:  In the Past 7 Days   I have been able to laugh and see the funny side of things. 0    I have looked forward with enjoyment to things. 0    I have blamed myself unnecessarily when things went wrong. 0    I have been anxious or worried for no good reason. 0    I have felt scared or panicky for no good reason. 0    Things have been getting on top of me. 0    I have been so unhappy that I have had difficulty sleeping. 0    I have felt sad or miserable. 0    I have been so unhappy that I have been crying. 0    The thought of harming myself has occurred to me. 0    Edinburgh Postnatal Depression Scale Total 0          Health Maintenance Due  Topic Date Due   HPV VACCINES (1 - 3-dose series) Never done   Meningococcal B Vaccine (1 of 2  - Standard) Never done   DTaP/Tdap/Td (1 - Tdap) Never done   Hepatitis B Vaccines (1 of 3 - 19+ 3-dose series) Never done   COVID-19 Vaccine (1 - 2024-25 season) Never done    The following portions of the patient's history were reviewed and updated as appropriate: allergies, current medications, past family history, past medical history, past social history, past surgical history, and problem list.  Review of Systems Pertinent items are noted in HPI.  Objective:  BP 112/73   Pulse 67   Ht 5' 4 (1.626 m)   Wt 140 lb (63.5 kg)   LMP 02/04/2023   Breastfeeding No   BMI 24.03 kg/m    General:  alert, cooperative, and no distress   Breasts:  not indicated  Lungs: clear to auscultation bilaterally  Heart:  regular rate and rhythm  Abdomen: soft, non-tender; bowel sounds normal; no masses,  no organomegaly   Wound N/a  GU exam:  not indicated       Assessment:    Encounter postpartum care normal postpartum exam.   Plan:   Essential components of care per ACOG recommendations:  1.  Mood and well being: Patient with negative depression screening today. Reviewed local resources for support.  -  Patient tobacco use? No.   - hx of drug use? No.    2. Infant care and feeding:  -Patient currently breastmilk feeding? No.  -Social determinants of health (SDOH) reviewed in EPIC. No concerns.  3. Sexuality, contraception and birth spacing - Patient does not want a pregnancy in the next year.  Desired family size is 1 children.  - Reviewed reproductive life planning. Reviewed contraceptive methods based on pt preferences and effectiveness.  Patient desired Hormonal Injection today.   - Discussed birth spacing of 18 months  4. Sleep and fatigue -Encouraged family/partner/community support of 4 hrs of uninterrupted sleep to help with mood and fatigue  5. Physical Recovery  - Discussed patients delivery and complications. She describes her labor as good. - Patient had a Vaginal,  no problems at delivery. Patient had a 2nd degree laceration. Perineal healing reviewed. Patient expressed understanding - Patient has urinary incontinence? No. - Patient is safe to resume physical and sexual activity  6.  Health Maintenance - HM due items addressed Yes - Last pap smear  Diagnosis  Date Value Ref Range Status  04/27/2023   Final   - Negative for intraepithelial lesion or malignancy (NILM)   Pap smear not done at today's visit.  -Breast Cancer screening indicated? No.   7. Chronic Disease/Pregnancy Condition follow up: Hypertension and Gestational Diabetes Will schedule for postpartum 2 hour GTT BP normal without meds Pt can return to work end of July - PCP follow up    Center for [employer], [employer]

## 2023-12-27 ENCOUNTER — Other Ambulatory Visit

## 2024-02-07 ENCOUNTER — Ambulatory Visit

## 2024-02-12 ENCOUNTER — Ambulatory Visit

## 2024-02-14 ENCOUNTER — Ambulatory Visit (INDEPENDENT_AMBULATORY_CARE_PROVIDER_SITE_OTHER)

## 2024-02-14 VITALS — BP 105/70 | HR 79 | Wt 145.8 lb

## 2024-02-14 DIAGNOSIS — Z3042 Encounter for surveillance of injectable contraceptive: Secondary | ICD-10-CM

## 2024-02-14 MED ORDER — MEDROXYPROGESTERONE ACETATE 150 MG/ML IM SUSP
150.0000 mg | INTRAMUSCULAR | Status: AC
  Administered 2024-02-14 – 2024-05-01 (×2): 150 mg via INTRAMUSCULAR

## 2024-02-14 NOTE — Progress Notes (Signed)
 Pt is in the office for depo injection Administered in R Del and pt tolerated well. Next due Dec 11-25 .SABRA Administrations This Visit     medroxyPROGESTERone  (DEPO-PROVERA ) injection 150 mg     Admin Date 02/14/2024 Action Given Dose 150 mg Route Intramuscular Documented By Doneta Laymon BIRCH, RN

## 2024-05-01 ENCOUNTER — Ambulatory Visit

## 2024-05-01 VITALS — BP 105/70 | HR 70 | Wt 153.3 lb

## 2024-05-01 DIAGNOSIS — Z3042 Encounter for surveillance of injectable contraceptive: Secondary | ICD-10-CM

## 2024-05-01 NOTE — Progress Notes (Signed)
 Date last pap: 04/27/2023. Last Depo-Provera : 02/14/24. Side Effects if any: N/a. Serum HCG indicated? N/a. Depo-Provera  150 mg IM given in L Del Next appointment due Feb 26- March 12.  .. Administrations This Visit     medroxyPROGESTERone  (DEPO-PROVERA ) injection 150 mg     Admin Date 05/01/2024 Action Given Dose 150 mg Route Intramuscular Documented By Doneta Laymon BIRCH, RN

## 2024-07-17 ENCOUNTER — Ambulatory Visit: Payer: Self-pay
